# Patient Record
Sex: Female | Born: 1965 | Race: White | Hispanic: No | Marital: Married | Smoking: Never smoker
Health system: Southern US, Community
[De-identification: ages and names within clinical notes are randomized; demographics above are authoritative.]

## PROBLEM LIST (undated history)

## (undated) DIAGNOSIS — T8859XA Other complications of anesthesia, initial encounter: Secondary | ICD-10-CM

## (undated) DIAGNOSIS — R112 Nausea with vomiting, unspecified: Secondary | ICD-10-CM

## (undated) DIAGNOSIS — K219 Gastro-esophageal reflux disease without esophagitis: Secondary | ICD-10-CM

## (undated) DIAGNOSIS — J189 Pneumonia, unspecified organism: Secondary | ICD-10-CM

## (undated) DIAGNOSIS — M1710 Unilateral primary osteoarthritis, unspecified knee: Secondary | ICD-10-CM

## (undated) DIAGNOSIS — Z9889 Other specified postprocedural states: Secondary | ICD-10-CM

## (undated) DIAGNOSIS — M179 Osteoarthritis of knee, unspecified: Secondary | ICD-10-CM

## (undated) DIAGNOSIS — T4145XA Adverse effect of unspecified anesthetic, initial encounter: Secondary | ICD-10-CM

## (undated) HISTORY — PX: BREAST ENHANCEMENT SURGERY: SHX7

## (undated) HISTORY — PX: KNEE SURGERY: SHX244

## (undated) HISTORY — PX: COLONOSCOPY: SHX174

## (undated) HISTORY — PX: VENOUS ABLATION: SHX2656

## (undated) HISTORY — PX: TONSILLECTOMY: SUR1361

## (undated) HISTORY — PX: APPENDECTOMY: SHX54

---

## 1989-03-01 HISTORY — PX: ABDOMINAL HYSTERECTOMY: SHX81

## 2012-07-24 ENCOUNTER — Other Ambulatory Visit (HOSPITAL_BASED_OUTPATIENT_CLINIC_OR_DEPARTMENT_OTHER): Payer: Self-pay | Admitting: Family Medicine

## 2012-07-24 ENCOUNTER — Ambulatory Visit (HOSPITAL_BASED_OUTPATIENT_CLINIC_OR_DEPARTMENT_OTHER)
Admission: RE | Admit: 2012-07-24 | Discharge: 2012-07-24 | Disposition: A | Discharge status: Home / Self Care | Payer: BC Managed Care – PPO | Source: Ambulatory Visit | Attending: Family Medicine | Admitting: Family Medicine

## 2012-07-24 DIAGNOSIS — M25569 Pain in unspecified knee: Secondary | ICD-10-CM | POA: Insufficient documentation

## 2012-07-24 DIAGNOSIS — R52 Pain, unspecified: Secondary | ICD-10-CM

## 2012-07-24 DIAGNOSIS — X500XXA Overexertion from strenuous movement or load, initial encounter: Secondary | ICD-10-CM | POA: Insufficient documentation

## 2012-07-24 DIAGNOSIS — S99929A Unspecified injury of unspecified foot, initial encounter: Secondary | ICD-10-CM | POA: Insufficient documentation

## 2012-07-24 DIAGNOSIS — Y9301 Activity, walking, marching and hiking: Secondary | ICD-10-CM | POA: Insufficient documentation

## 2012-07-24 DIAGNOSIS — S8990XA Unspecified injury of unspecified lower leg, initial encounter: Secondary | ICD-10-CM | POA: Insufficient documentation

## 2014-04-02 ENCOUNTER — Other Ambulatory Visit: Payer: Self-pay | Admitting: Physician Assistant

## 2014-04-02 NOTE — H&P (Signed)
TOTAL KNEE ADMISSION H&P  Patient is being admitted for left total knee arthroplasty.  Subjective:  Chief Complaint:left knee pain.  HPI: [name], 49 y.o. female, has a history of pain and functional disability in the left knee due to arthritis and has failed non-surgical conservative treatments for greater than 12 weeks to includeNSAID's and/or analgesics, corticosteriod injections, viscosupplementation injections and activity modification.  Onset of symptoms was gradual, starting 2 years ago with rapidlly worsening course since that time. The patient noted prior procedures on the knee to include  arthroscopy and menisectomy on the left knee(s).  Patient currently rates pain in the left knee(s) at 8 out of 10 with activity. Patient has night pain, worsening of pain with activity and weight bearing, pain that interferes with activities of daily living, crepitus and joint swelling.  Patient has evidence of subchondral sclerosis and joint space narrowing by imaging studies. There is no active infection.  There are no active problems to display for this patient.  No past medical history on file.  No past surgical history on file.   (Not in a hospital admission) Allergies not on file  History  Substance Use Topics  . Smoking status: Not on file  . Smokeless tobacco: Not on file  . Alcohol Use: Not on file    No family history on file.   Review of Systems  Constitutional: Negative.   HENT: Negative.   Eyes: Negative.   Respiratory: Negative.   Cardiovascular: Negative.   Gastrointestinal: Negative.   Genitourinary: Negative.   Musculoskeletal: Positive for joint pain.  Skin: Negative.   Neurological: Negative.   Endo/Heme/Allergies: Negative.   Psychiatric/Behavioral: Negative.     Objective:  Physical Exam  Constitutional: She is oriented to person, place, and time. She appears well-developed and well-nourished.  HENT:  Head: Normocephalic and atraumatic.  Eyes: EOM  are normal. Pupils are equal, round, and reactive to light.  Neck: Normal range of motion. Neck supple.  Cardiovascular: Normal rate and regular rhythm.  Exam reveals no friction rub.   No murmur heard. Respiratory: Effort normal and breath sounds normal. No respiratory distress. She has no wheezes. She has no rales.  GI: Soft. Bowel sounds are normal. She exhibits no distension.  Musculoskeletal:  Examination of her left knee reveals a trace effusion.  Range of motion 0-90 degrees.  Medial joint line tenderness to palpation.  Moderate patellofemoral crepitus.  Negative straight leg raise.  Negative log roll.  She is stable to valgus and varus stress.  She is neurovascularly intact distally.  Neurological: She is alert and oriented to person, place, and time.  Skin: Skin is warm and dry.  Psychiatric: She has a normal mood and affect. Her behavior is normal. Judgment and thought content normal.    Vital signs in last 24 hours: @VSRANGES @  Labs:   There is no height or weight on file to calculate BMI.   Imaging Review Plain radiographs demonstrate severe degenerative joint disease of the left knee(s). The overall alignment ismild varus. The bone quality appears to be fair for age and reported activity level.  Assessment/Plan:  End stage arthritis, left knee   The patient history, physical examination, clinical judgment of the provider and imaging studies are consistent with end stage degenerative joint disease of the left knee(s) and total knee arthroplasty is deemed medically necessary. The treatment options including medical management, injection therapy arthroscopy and arthroplasty were discussed at length. The risks and benefits of total knee arthroplasty were presented  and reviewed. The risks due to aseptic loosening, infection, stiffness, patella tracking problems, thromboembolic complications and other imponderables were discussed. The patient acknowledged the explanation, agreed  to proceed with the plan and consent was signed. Patient is being admitted for inpatient treatment for surgery, pain control, PT, OT, prophylactic antibiotics, VTE prophylaxis, progressive ambulation and ADL's and discharge planning. The patient is planning to be discharged home with home health services

## 2014-04-04 ENCOUNTER — Encounter (HOSPITAL_COMMUNITY)
Admission: RE | Admit: 2014-04-04 | Discharge: 2014-04-04 | Disposition: A | Discharge status: Home / Self Care | Payer: BLUE CROSS/BLUE SHIELD | Source: Ambulatory Visit | Attending: Orthopedic Surgery | Admitting: Orthopedic Surgery

## 2014-04-04 ENCOUNTER — Encounter (HOSPITAL_COMMUNITY): Payer: Self-pay

## 2014-04-04 DIAGNOSIS — Z0183 Encounter for blood typing: Secondary | ICD-10-CM | POA: Diagnosis not present

## 2014-04-04 DIAGNOSIS — M179 Osteoarthritis of knee, unspecified: Secondary | ICD-10-CM | POA: Insufficient documentation

## 2014-04-04 DIAGNOSIS — Z01812 Encounter for preprocedural laboratory examination: Secondary | ICD-10-CM | POA: Diagnosis present

## 2014-04-04 DIAGNOSIS — R9431 Abnormal electrocardiogram [ECG] [EKG]: Secondary | ICD-10-CM | POA: Diagnosis not present

## 2014-04-04 HISTORY — DX: Other specified postprocedural states: Z98.890

## 2014-04-04 HISTORY — DX: Gastro-esophageal reflux disease without esophagitis: K21.9

## 2014-04-04 HISTORY — DX: Pneumonia, unspecified organism: J18.9

## 2014-04-04 HISTORY — DX: Other complications of anesthesia, initial encounter: T88.59XA

## 2014-04-04 HISTORY — DX: Adverse effect of unspecified anesthetic, initial encounter: T41.45XA

## 2014-04-04 HISTORY — DX: Nausea with vomiting, unspecified: R11.2

## 2014-04-04 LAB — PROTIME-INR
INR: 0.96 (ref 0.00–1.49)
PROTHROMBIN TIME: 12.9 s (ref 11.6–15.2)

## 2014-04-04 LAB — CBC WITH DIFFERENTIAL/PLATELET
Basophils Absolute: 0 10*3/uL (ref 0.0–0.1)
Basophils Relative: 0 % (ref 0–1)
EOS PCT: 2 % (ref 0–5)
Eosinophils Absolute: 0.1 10*3/uL (ref 0.0–0.7)
HCT: 40.3 % (ref 36.0–46.0)
Hemoglobin: 13.2 g/dL (ref 12.0–15.0)
LYMPHS ABS: 2.1 10*3/uL (ref 0.7–4.0)
Lymphocytes Relative: 36 % (ref 12–46)
MCH: 27.3 pg (ref 26.0–34.0)
MCHC: 32.8 g/dL (ref 30.0–36.0)
MCV: 83.4 fL (ref 78.0–100.0)
Monocytes Absolute: 0.5 10*3/uL (ref 0.1–1.0)
Monocytes Relative: 8 % (ref 3–12)
Neutro Abs: 3.2 10*3/uL (ref 1.7–7.7)
Neutrophils Relative %: 54 % (ref 43–77)
Platelets: 358 10*3/uL (ref 150–400)
RBC: 4.83 MIL/uL (ref 3.87–5.11)
RDW: 14.1 % (ref 11.5–15.5)
WBC: 6 10*3/uL (ref 4.0–10.5)

## 2014-04-04 LAB — COMPREHENSIVE METABOLIC PANEL
ALT: 15 U/L (ref 0–35)
AST: 19 U/L (ref 0–37)
Albumin: 4 g/dL (ref 3.5–5.2)
Alkaline Phosphatase: 84 U/L (ref 39–117)
Anion gap: 6 (ref 5–15)
BILIRUBIN TOTAL: 0.5 mg/dL (ref 0.3–1.2)
BUN: 10 mg/dL (ref 6–23)
CALCIUM: 9.3 mg/dL (ref 8.4–10.5)
CO2: 30 mmol/L (ref 19–32)
Chloride: 102 mmol/L (ref 96–112)
Creatinine, Ser: 0.68 mg/dL (ref 0.50–1.10)
GFR calc Af Amer: >90 mL/min (ref >90)
GFR calc non Af Amer: >90 mL/min (ref >90)
Glucose, Bld: 94 mg/dL (ref 70–99)
Potassium: 3.6 mmol/L (ref 3.5–5.1)
SODIUM: 138 mmol/L (ref 135–145)
Total Protein: 7 g/dL (ref 6.0–8.3)

## 2014-04-04 LAB — URINALYSIS, ROUTINE W REFLEX MICROSCOPIC
BILIRUBIN URINE: NEGATIVE
Glucose, UA: NEGATIVE mg/dL
HGB URINE DIPSTICK: NEGATIVE
Ketones, ur: NEGATIVE mg/dL
LEUKOCYTES UA: NEGATIVE
Nitrite: NEGATIVE
Protein, ur: NEGATIVE mg/dL
Specific Gravity, Urine: 1.01 (ref 1.005–1.030)
Urobilinogen, UA: 0.2 mg/dL (ref 0.0–1.0)
pH: 7 (ref 5.0–8.0)

## 2014-04-04 LAB — SURGICAL PCR SCREEN
MRSA, PCR: NEGATIVE
Staphylococcus aureus: NEGATIVE

## 2014-04-04 LAB — TYPE AND SCREEN
ABO/RH(D): O POS
Antibody Screen: NEGATIVE

## 2014-04-04 LAB — ABO/RH: ABO/RH(D): O POS

## 2014-04-04 LAB — APTT: APTT: 32 s (ref 24–37)

## 2014-04-04 NOTE — Pre-Procedure Instructions (Signed)
[name]  04/04/2014   Your procedure is scheduled on:  Wednesday April 17, 2014 at 9:55 AM.  [facility] at 7:50 AM.  Call this number if you have problems the morning of surgery: [phone]  For any other questions Monday-Friday from 8am-4pm call: [phone]    Remember:   Do not eat food or drink liquids after midnight.   Take these medicines the morning of surgery with A SIP OF WATER: Acetaminophen (Tylenol) if needed, Rabeprazole (Aciphex)   Please stop taking any Ibuprofen, Advil, Motrin, Alleve, etc on Wednesday February 10th   Do not wear jewelry, make-up or nail polish.  Do not wear lotions, powders, or perfumes.   Do not shave 48 hours prior to surgery.   Do not bring valuables to the hospital.  [facility] is not responsible for any belongings or valuables.               Contacts, dentures or bridgework may not be worn into surgery.  Leave suitcase in the car. After surgery it may be brought to your room.  For patients admitted to the hospital, discharge time is determined by your treatment team.               Patients discharged the day of surgery will not be allowed to drive home.  Name and phone number of your driver:  Special Instructions: Shower using CHG soap the night before and the morning of your surgery   Please read over the following fact sheets that you were given: Pain Booklet, Coughing and Deep Breathing, Blood Transfusion Information, Total Joint Packet, MRSA Information and Surgical Site Infection Prevention

## 2014-04-04 NOTE — Progress Notes (Signed)
Patient was scheduled for a 1100 PAT appointment, but stated Dr. Greig RightMurphy's office told her to arrive here today at 0830. Husband at chair side during PAT appointment. Nurse explained to patient and her husband that PAT does not schedule half hour appointments, but it was OK and she could be fitted into schedule. Patient became tearful during PAT and appeared to be anxious about upcoming surgery. Nurse explained to patient that it was OK and not to worry about the 1100 appointment.  Facial tissues offered. Tears creased.  Patient denied having any cardiac or pulmonary issues. PCP is [facility].   Patient asked Nurse if Vivelle-Dot (Estrogen patch) needed to be stopped before surgery. Patient stated she asked Lillia AbedLindsay, [place] at Dr. Greig RightMurphy's office yesterday and she stated she thought it was ok for her to continue taking it, but she needed to ask during PAT appointment. Patient also stated that a friend of hers recently had knee surgery and she was told to stop using her estrogen patch a few weeks before surgery. Patients husband stated "oh you don't want her off that patch....not even the devil would want to deal with her!" Nurse called Dr. Katrinka BlazingSmith (Anesthesia) and informed him of this, and he stated that he did not want the patient to lose her "femininity" and that it was OK for her to continue use of estrogen patch. Patient informed of this and seemed to be very happy about continuing estrogen patch.

## 2014-04-05 LAB — URINE CULTURE
COLONY COUNT: NO GROWTH
Culture: NO GROWTH

## 2014-04-16 MED ORDER — LACTATED RINGERS IV SOLN
INTRAVENOUS | Status: DC
  Administered 2014-04-17: 08:00:00 via INTRAVENOUS

## 2014-04-16 MED ORDER — CEFAZOLIN SODIUM-DEXTROSE 2-3 GM-% IV SOLR
2.0000 g | INTRAVENOUS | Status: AC
  Administered 2014-04-17: 2 g via INTRAVENOUS

## 2014-04-17 ENCOUNTER — Inpatient Hospital Stay (HOSPITAL_COMMUNITY): Payer: BLUE CROSS/BLUE SHIELD | Admitting: Anesthesiology

## 2014-04-17 ENCOUNTER — Encounter (HOSPITAL_COMMUNITY)
Admission: RE | Disposition: A | Discharge status: Home Health | Payer: Self-pay | Source: Ambulatory Visit | Attending: Orthopedic Surgery

## 2014-04-17 ENCOUNTER — Inpatient Hospital Stay (HOSPITAL_COMMUNITY)
Admission: RE | Admit: 2014-04-17 | Discharge: 2014-04-19 | DRG: 470 | Disposition: A | Discharge status: Home Health | Payer: BLUE CROSS/BLUE SHIELD | Source: Ambulatory Visit | Attending: Orthopedic Surgery | Admitting: Orthopedic Surgery

## 2014-04-17 ENCOUNTER — Encounter (HOSPITAL_COMMUNITY): Payer: Self-pay | Admitting: *Deleted

## 2014-04-17 ENCOUNTER — Inpatient Hospital Stay (HOSPITAL_COMMUNITY): Payer: BLUE CROSS/BLUE SHIELD

## 2014-04-17 DIAGNOSIS — M179 Osteoarthritis of knee, unspecified: Secondary | ICD-10-CM | POA: Diagnosis present

## 2014-04-17 DIAGNOSIS — Z7901 Long term (current) use of anticoagulants: Secondary | ICD-10-CM | POA: Diagnosis not present

## 2014-04-17 DIAGNOSIS — D62 Acute posthemorrhagic anemia: Secondary | ICD-10-CM | POA: Diagnosis not present

## 2014-04-17 DIAGNOSIS — M1712 Unilateral primary osteoarthritis, left knee: Secondary | ICD-10-CM | POA: Diagnosis present

## 2014-04-17 DIAGNOSIS — Z79899 Other long term (current) drug therapy: Secondary | ICD-10-CM

## 2014-04-17 DIAGNOSIS — M25562 Pain in left knee: Secondary | ICD-10-CM | POA: Diagnosis present

## 2014-04-17 DIAGNOSIS — Z6834 Body mass index (BMI) 34.0-34.9, adult: Secondary | ICD-10-CM

## 2014-04-17 DIAGNOSIS — M1710 Unilateral primary osteoarthritis, unspecified knee: Secondary | ICD-10-CM | POA: Diagnosis present

## 2014-04-17 DIAGNOSIS — K219 Gastro-esophageal reflux disease without esophagitis: Secondary | ICD-10-CM | POA: Diagnosis present

## 2014-04-17 DIAGNOSIS — R338 Other retention of urine: Secondary | ICD-10-CM | POA: Diagnosis not present

## 2014-04-17 DIAGNOSIS — Z96652 Presence of left artificial knee joint: Secondary | ICD-10-CM

## 2014-04-17 DIAGNOSIS — Z96659 Presence of unspecified artificial knee joint: Secondary | ICD-10-CM

## 2014-04-17 HISTORY — DX: Osteoarthritis of knee, unspecified: M17.9

## 2014-04-17 HISTORY — DX: Unilateral primary osteoarthritis, unspecified knee: M17.10

## 2014-04-17 HISTORY — PX: TOTAL KNEE ARTHROPLASTY: SHX125

## 2014-04-17 SURGERY — ARTHROPLASTY, KNEE, TOTAL
Anesthesia: General | Site: Knee | Laterality: Left

## 2014-04-17 MED ORDER — METHOCARBAMOL 500 MG PO TABS: 500.0000 mg | ORAL_TABLET | Freq: Four times a day (QID) | ORAL | Status: DC

## 2014-04-17 MED ORDER — PROPOFOL 10 MG/ML IV BOLUS
INTRAVENOUS | Status: DC | PRN
  Administered 2014-04-17: 150 mg via INTRAVENOUS

## 2014-04-17 MED ORDER — APIXABAN 2.5 MG PO TABS
2.5000 mg | ORAL_TABLET | Freq: Two times a day (BID) | ORAL | Status: DC
  Administered 2014-04-18 – 2014-04-19 (×3): 2.5 mg via ORAL
  Filled 2014-04-17 (×4): qty 1

## 2014-04-17 MED ORDER — ROCURONIUM BROMIDE 100 MG/10ML IV SOLN
INTRAVENOUS | Status: DC | PRN
  Administered 2014-04-17: 40 mg via INTRAVENOUS

## 2014-04-17 MED ORDER — ONDANSETRON HCL 4 MG PO TABS: 4.0000 mg | ORAL_TABLET | Freq: Four times a day (QID) | ORAL | Status: DC | PRN

## 2014-04-17 MED ORDER — ADULT MULTIVITAMIN W/MINERALS CH
1.0000 | ORAL_TABLET | Freq: Every day | ORAL | Status: DC
  Administered 2014-04-18 – 2014-04-19 (×2): 1 via ORAL
  Filled 2014-04-17 (×4): qty 1

## 2014-04-17 MED ORDER — HYDROMORPHONE HCL 1 MG/ML IJ SOLN
0.2500 mg | INTRAMUSCULAR | Status: DC | PRN
  Administered 2014-04-17 (×3): 0.5 mg via INTRAVENOUS

## 2014-04-17 MED ORDER — DEXAMETHASONE SODIUM PHOSPHATE 4 MG/ML IJ SOLN
INTRAMUSCULAR | Status: DC | PRN
  Administered 2014-04-17: 8 mg via INTRAVENOUS

## 2014-04-17 MED ORDER — ONDANSETRON HCL 4 MG/2ML IJ SOLN
INTRAMUSCULAR | Status: DC | PRN
  Administered 2014-04-17: 4 mg via INTRAVENOUS

## 2014-04-17 MED ORDER — BUPIVACAINE LIPOSOME 1.3 % IJ SUSP
INTRAMUSCULAR | Status: DC | PRN
  Administered 2014-04-17: 20 mL

## 2014-04-17 MED ORDER — KETOROLAC TROMETHAMINE 30 MG/ML IJ SOLN
30.0000 mg | Freq: Once | INTRAMUSCULAR | Status: AC
  Administered 2014-04-17: 30 mg via INTRAVENOUS

## 2014-04-17 MED ORDER — KETOROLAC TROMETHAMINE 30 MG/ML IJ SOLN
INTRAMUSCULAR | Status: AC
  Filled 2014-04-17: qty 1

## 2014-04-17 MED ORDER — ACETAMINOPHEN 325 MG PO TABS: 650.0000 mg | ORAL_TABLET | Freq: Four times a day (QID) | ORAL | Status: DC | PRN

## 2014-04-17 MED ORDER — OXYCODONE HCL 5 MG PO TABS
5.0000 mg | ORAL_TABLET | Freq: Once | ORAL | Status: AC | PRN
  Administered 2014-04-17: 5 mg via ORAL

## 2014-04-17 MED ORDER — CEFAZOLIN SODIUM-DEXTROSE 2-3 GM-% IV SOLR
2.0000 g | Freq: Four times a day (QID) | INTRAVENOUS | Status: AC
  Administered 2014-04-17 (×2): 2 g via INTRAVENOUS
  Filled 2014-04-17 (×2): qty 50

## 2014-04-17 MED ORDER — FENTANYL CITRATE 0.05 MG/ML IJ SOLN
INTRAMUSCULAR | Status: AC
  Filled 2014-04-17: qty 5

## 2014-04-17 MED ORDER — OXYCODONE HCL 5 MG PO TABS
ORAL_TABLET | ORAL | Status: AC
  Filled 2014-04-17: qty 1

## 2014-04-17 MED ORDER — METOCLOPRAMIDE HCL 10 MG PO TABS: 5.0000 mg | ORAL_TABLET | Freq: Three times a day (TID) | ORAL | Status: DC | PRN

## 2014-04-17 MED ORDER — ACETAMINOPHEN 650 MG RE SUPP: 650.0000 mg | Freq: Four times a day (QID) | RECTAL | Status: DC | PRN

## 2014-04-17 MED ORDER — PROPOFOL 10 MG/ML IV BOLUS
INTRAVENOUS | Status: AC
  Filled 2014-04-17: qty 20

## 2014-04-17 MED ORDER — SODIUM CHLORIDE 0.9 % IR SOLN
Status: DC | PRN
  Administered 2014-04-17 (×2): 1000 mL

## 2014-04-17 MED ORDER — GLYCOPYRROLATE 0.2 MG/ML IJ SOLN
INTRAMUSCULAR | Status: DC | PRN
  Administered 2014-04-17: .7 mg via INTRAVENOUS

## 2014-04-17 MED ORDER — OXYCODONE HCL 5 MG/5ML PO SOLN: 5.0000 mg | Freq: Once | ORAL | Status: AC | PRN

## 2014-04-17 MED ORDER — PANTOPRAZOLE SODIUM 40 MG PO TBEC
40.0000 mg | DELAYED_RELEASE_TABLET | Freq: Every day | ORAL | Status: DC
  Administered 2014-04-18 – 2014-04-19 (×2): 40 mg via ORAL
  Filled 2014-04-17 (×2): qty 1

## 2014-04-17 MED ORDER — POTASSIUM CHLORIDE IN NACL 20-0.9 MEQ/L-% IV SOLN
INTRAVENOUS | Status: DC
  Administered 2014-04-18: 04:00:00 via INTRAVENOUS
  Filled 2014-04-17 (×3): qty 1000

## 2014-04-17 MED ORDER — DIPHENHYDRAMINE HCL 12.5 MG/5ML PO ELIX: 12.5000 mg | ORAL_SOLUTION | ORAL | Status: DC | PRN

## 2014-04-17 MED ORDER — FENTANYL CITRATE 0.05 MG/ML IJ SOLN
INTRAMUSCULAR | Status: DC | PRN
  Administered 2014-04-17: 150 ug via INTRAVENOUS
  Administered 2014-04-17: 100 ug via INTRAVENOUS

## 2014-04-17 MED ORDER — BUPIVACAINE HCL 0.5 % IJ SOLN
INTRAMUSCULAR | Status: DC | PRN
  Administered 2014-04-17: 10 mL

## 2014-04-17 MED ORDER — DOCUSATE SODIUM 100 MG PO CAPS
100.0000 mg | ORAL_CAPSULE | Freq: Two times a day (BID) | ORAL | Status: DC
  Administered 2014-04-17 – 2014-04-18 (×3): 100 mg via ORAL
  Filled 2014-04-17 (×3): qty 1

## 2014-04-17 MED ORDER — BISACODYL 5 MG PO TBEC: 5.0000 mg | DELAYED_RELEASE_TABLET | Freq: Every day | ORAL | Status: DC | PRN

## 2014-04-17 MED ORDER — HYDROMORPHONE HCL 1 MG/ML IJ SOLN
INTRAMUSCULAR | Status: AC
  Filled 2014-04-17: qty 2

## 2014-04-17 MED ORDER — 0.9 % SODIUM CHLORIDE (POUR BTL) OPTIME
TOPICAL | Status: DC | PRN
  Administered 2014-04-17: 1000 mL

## 2014-04-17 MED ORDER — LACTATED RINGERS IV SOLN
INTRAVENOUS | Status: DC | PRN
  Administered 2014-04-17 (×2): via INTRAVENOUS

## 2014-04-17 MED ORDER — PROMETHAZINE HCL 25 MG/ML IJ SOLN: 6.2500 mg | INTRAMUSCULAR | Status: DC | PRN

## 2014-04-17 MED ORDER — NEOSTIGMINE METHYLSULFATE 10 MG/10ML IV SOLN
INTRAVENOUS | Status: DC | PRN
  Administered 2014-04-17: 4 mg via INTRAVENOUS

## 2014-04-17 MED ORDER — MENTHOL 3 MG MT LOZG: 1.0000 | LOZENGE | OROMUCOSAL | Status: DC | PRN

## 2014-04-17 MED ORDER — METOCLOPRAMIDE HCL 5 MG/ML IJ SOLN: 5.0000 mg | Freq: Three times a day (TID) | INTRAMUSCULAR | Status: DC | PRN

## 2014-04-17 MED ORDER — MIDAZOLAM HCL 5 MG/5ML IJ SOLN
INTRAMUSCULAR | Status: DC | PRN
  Administered 2014-04-17: 2 mg via INTRAVENOUS

## 2014-04-17 MED ORDER — LIDOCAINE HCL (CARDIAC) 20 MG/ML IV SOLN
INTRAVENOUS | Status: DC | PRN
  Administered 2014-04-17: 70 mg via INTRAVENOUS

## 2014-04-17 MED ORDER — DEXAMETHASONE SODIUM PHOSPHATE 10 MG/ML IJ SOLN
10.0000 mg | Freq: Once | INTRAMUSCULAR | Status: AC
  Administered 2014-04-18: 10 mg via INTRAVENOUS
  Filled 2014-04-17: qty 1

## 2014-04-17 MED ORDER — BUPIVACAINE LIPOSOME 1.3 % IJ SUSP
20.0000 mL | INTRAMUSCULAR | Status: DC
  Filled 2014-04-17: qty 20

## 2014-04-17 MED ORDER — OXYCODONE HCL 5 MG PO TABS
5.0000 mg | ORAL_TABLET | ORAL | Status: DC | PRN
  Administered 2014-04-17 – 2014-04-19 (×13): 10 mg via ORAL
  Filled 2014-04-17 (×13): qty 2

## 2014-04-17 MED ORDER — METHOCARBAMOL 500 MG PO TABS
500.0000 mg | ORAL_TABLET | Freq: Four times a day (QID) | ORAL | Status: DC | PRN
Start: 2014-04-17 — End: 2014-04-19
  Administered 2014-04-17 – 2014-04-19 (×7): 500 mg via ORAL
  Filled 2014-04-17 (×7): qty 1

## 2014-04-17 MED ORDER — MIDAZOLAM HCL 2 MG/2ML IJ SOLN
INTRAMUSCULAR | Status: AC
  Filled 2014-04-17: qty 2

## 2014-04-17 MED ORDER — HYDROMORPHONE HCL 1 MG/ML IJ SOLN
0.5000 mg | INTRAMUSCULAR | Status: DC | PRN
  Filled 2014-04-17: qty 1

## 2014-04-17 MED ORDER — CELECOXIB 200 MG PO CAPS
200.0000 mg | ORAL_CAPSULE | Freq: Two times a day (BID) | ORAL | Status: DC
  Administered 2014-04-17 – 2014-04-19 (×4): 200 mg via ORAL
  Filled 2014-04-17 (×4): qty 1

## 2014-04-17 MED ORDER — HYDROMORPHONE HCL 1 MG/ML IJ SOLN
0.2500 mg | INTRAMUSCULAR | Status: DC | PRN
  Administered 2014-04-17 (×4): 0.5 mg via INTRAVENOUS

## 2014-04-17 MED ORDER — ONDANSETRON HCL 4 MG PO TABS: 4.0000 mg | ORAL_TABLET | Freq: Three times a day (TID) | ORAL | Status: DC | PRN

## 2014-04-17 MED ORDER — METHOCARBAMOL 1000 MG/10ML IJ SOLN
500.0000 mg | Freq: Four times a day (QID) | INTRAVENOUS | Status: DC | PRN
  Administered 2014-04-17: 500 mg via INTRAVENOUS
  Filled 2014-04-17 (×2): qty 5

## 2014-04-17 MED ORDER — ONDANSETRON HCL 4 MG/2ML IJ SOLN
4.0000 mg | Freq: Four times a day (QID) | INTRAMUSCULAR | Status: DC | PRN
  Administered 2014-04-17: 4 mg via INTRAVENOUS
  Filled 2014-04-17: qty 2

## 2014-04-17 MED ORDER — OXYCODONE-ACETAMINOPHEN 5-325 MG PO TABS: 1.0000 | ORAL_TABLET | ORAL | Status: DC | PRN

## 2014-04-17 MED ORDER — PHENOL 1.4 % MT LIQD: 1.0000 | OROMUCOSAL | Status: DC | PRN

## 2014-04-17 SURGICAL SUPPLY — 67 items
BANDAGE ELASTIC 4 VELCRO ST LF (GAUZE/BANDAGES/DRESSINGS) ×3 IMPLANT
BANDAGE ELASTIC 6 VELCRO ST LF (GAUZE/BANDAGES/DRESSINGS) ×3 IMPLANT
BANDAGE ESMARK 6X9 LF (GAUZE/BANDAGES/DRESSINGS) ×1 IMPLANT
BENZOIN TINCTURE PRP APPL 2/3 (GAUZE/BANDAGES/DRESSINGS) ×3 IMPLANT
BLADE SAG 18X100X1.27 (BLADE) ×6 IMPLANT
BLADE SAW SGTL 83.5X18.5 (BLADE) IMPLANT
BNDG ESMARK 6X9 LF (GAUZE/BANDAGES/DRESSINGS) ×3
BOWL SMART MIX CTS (DISPOSABLE) ×3 IMPLANT
CAPT KNEE TOTAL 3 ×3 IMPLANT
CEMENT BONE SIMPLEX SPEEDSET (Cement) ×6 IMPLANT
CLOSURE WOUND 1/2 X4 (GAUZE/BANDAGES/DRESSINGS) ×1
COVER SURGICAL LIGHT HANDLE (MISCELLANEOUS) ×3 IMPLANT
CUFF TOURNIQUET SINGLE 34IN LL (TOURNIQUET CUFF) ×3 IMPLANT
DRAPE EXTREMITY T 121X128X90 (DRAPE) ×3 IMPLANT
DRAPE IMP U-DRAPE 54X76 (DRAPES) ×3 IMPLANT
DRAPE PROXIMA HALF (DRAPES) ×3 IMPLANT
DRAPE U-SHAPE 47X51 STRL (DRAPES) ×3 IMPLANT
DRSG PAD ABDOMINAL 8X10 ST (GAUZE/BANDAGES/DRESSINGS) ×3 IMPLANT
DURAPREP 26ML APPLICATOR (WOUND CARE) ×6 IMPLANT
ELECT CAUTERY BLADE 6.4 (BLADE) ×3 IMPLANT
ELECT REM PT RETURN 9FT ADLT (ELECTROSURGICAL) ×3
ELECTRODE REM PT RTRN 9FT ADLT (ELECTROSURGICAL) ×1 IMPLANT
EVACUATOR 1/8 PVC DRAIN (DRAIN) ×3 IMPLANT
FACESHIELD WRAPAROUND (MASK) ×6 IMPLANT
GAUZE SPONGE 4X4 12PLY STRL (GAUZE/BANDAGES/DRESSINGS) ×3 IMPLANT
GLOVE BIOGEL PI IND STRL 7.0 (GLOVE) ×3 IMPLANT
GLOVE BIOGEL PI INDICATOR 7.0 (GLOVE) ×6
GLOVE ECLIPSE 6.5 STRL STRAW (GLOVE) ×12 IMPLANT
GLOVE ORTHO TXT STRL SZ7.5 (GLOVE) ×3 IMPLANT
GLOVE SURG SS PI 8.0 STRL IVOR (GLOVE) ×6 IMPLANT
GOWN STRL REUS W/ TWL LRG LVL3 (GOWN DISPOSABLE) ×2 IMPLANT
GOWN STRL REUS W/ TWL XL LVL3 (GOWN DISPOSABLE) ×1 IMPLANT
GOWN STRL REUS W/TWL 2XL LVL3 (GOWN DISPOSABLE) ×3 IMPLANT
GOWN STRL REUS W/TWL LRG LVL3 (GOWN DISPOSABLE) ×4
GOWN STRL REUS W/TWL XL LVL3 (GOWN DISPOSABLE) ×2
HANDPIECE INTERPULSE COAX TIP (DISPOSABLE) ×2
IMMOBILIZER KNEE 22 UNIV (SOFTGOODS) ×3 IMPLANT
IMMOBILIZER KNEE 24 THIGH 36 (MISCELLANEOUS) IMPLANT
IMMOBILIZER KNEE 24 UNIV (MISCELLANEOUS)
KIT BASIN OR (CUSTOM PROCEDURE TRAY) ×3 IMPLANT
KIT ROOM TURNOVER OR (KITS) ×3 IMPLANT
MANIFOLD NEPTUNE II (INSTRUMENTS) ×3 IMPLANT
NEEDLE 18GX1X1/2 (RX/OR ONLY) (NEEDLE) ×3 IMPLANT
NEEDLE HYPO 25GX1X1/2 BEV (NEEDLE) ×3 IMPLANT
NS IRRIG 1000ML POUR BTL (IV SOLUTION) ×3 IMPLANT
PACK TOTAL JOINT (CUSTOM PROCEDURE TRAY) ×3 IMPLANT
PACK UNIVERSAL I (CUSTOM PROCEDURE TRAY) ×3 IMPLANT
PAD ARMBOARD 7.5X6 YLW CONV (MISCELLANEOUS) ×6 IMPLANT
PAD CAST 4YDX4 CTTN HI CHSV (CAST SUPPLIES) IMPLANT
PADDING CAST COTTON 4X4 STRL (CAST SUPPLIES)
PADDING CAST COTTON 6X4 STRL (CAST SUPPLIES) ×3 IMPLANT
SET HNDPC FAN SPRY TIP SCT (DISPOSABLE) ×1 IMPLANT
STRIP CLOSURE SKIN 1/2X4 (GAUZE/BANDAGES/DRESSINGS) ×2 IMPLANT
SUCTION FRAZIER TIP 10 FR DISP (SUCTIONS) ×3 IMPLANT
SUT MNCRL AB 4-0 PS2 18 (SUTURE) ×3 IMPLANT
SUT VIC AB 0 CT1 27 (SUTURE) ×4
SUT VIC AB 0 CT1 27XBRD ANBCTR (SUTURE) ×2 IMPLANT
SUT VIC AB 1 CT1 27 (SUTURE) ×4
SUT VIC AB 1 CT1 27XBRD ANBCTR (SUTURE) ×2 IMPLANT
SUT VIC AB 2-0 CT1 27 (SUTURE) ×6
SUT VIC AB 2-0 CT1 TAPERPNT 27 (SUTURE) ×3 IMPLANT
SYR 50ML LL SCALE MARK (SYRINGE) ×3 IMPLANT
SYR CONTROL 10ML LL (SYRINGE) ×3 IMPLANT
TOWEL OR 17X24 6PK STRL BLUE (TOWEL DISPOSABLE) ×3 IMPLANT
TOWEL OR 17X26 10 PK STRL BLUE (TOWEL DISPOSABLE) ×3 IMPLANT
TRAY CATH 16FR W/PLASTIC CATH (SET/KITS/TRAYS/PACK) ×3 IMPLANT
WATER STERILE IRR 1000ML POUR (IV SOLUTION) IMPLANT

## 2014-04-17 NOTE — Transfer of Care (Signed)
Immediate Anesthesia Transfer of Care Note  Patient: [name]  Procedure(s) Performed: Procedure(s): TOTAL KNEE ARTHROPLASTY (Left)  Patient Location: PACU  Anesthesia Type:General  Level of Consciousness: awake, alert  and oriented  Airway & Oxygen Therapy: Patient Spontanous Breathing and Patient connected to nasal cannula oxygen  Post-op Assessment: Report given to RN and Post -op Vital signs reviewed and stable  Post vital signs: Reviewed and stable  Last Vitals:  Filed Vitals:   04/17/14 0817  BP: 141/61  Pulse: 83  Temp: 36.4 C  Resp: 18    Complications: No apparent anesthesia complications

## 2014-04-17 NOTE — Progress Notes (Signed)
Husband placed pt hormone patch on R buttock for pt. Per husband they were told to replace it as soon as she comes out of surgery.

## 2014-04-17 NOTE — Progress Notes (Signed)
Utilization review completed.  

## 2014-04-17 NOTE — Progress Notes (Signed)
Orthopedic Tech Progress Note Patient Details:  [name] 08/26/1965 [id number]  CPM Left Knee CPM Left Knee: On Left Knee Flexion (Degrees): 90 Left Knee Extension (Degrees): 0 Additional Comments: trapeze bar patient helper Viewed order from doctor's order list  [name], Vincy Feliz 04/17/2014, 12:08 PM

## 2014-04-17 NOTE — Progress Notes (Signed)
PT Cancellation Note  Patient Details Name: [name] MRN: [id number] DOB: 11/22/1965   Cancelled Treatment:    Reason Eval/Treat Not Completed: Fatigue/lethargy limiting ability to participate.  Pt lethargic and nauseated.  PT will attempt to check back a little later this PM to see if she feels up to EOB or OOB to chair.  If not, will re-attempt tomorrow.  Thanks,    Rollene Rotundaebecca B. Lizanne Erker, PT, DPT [phone]   04/17/2014, 4:08 PM

## 2014-04-17 NOTE — H&P (View-Only) (Signed)
TOTAL KNEE ADMISSION H&P  Patient is being admitted for left total knee arthroplasty.  Subjective:  Chief Complaint:left knee pain.  HPI: [name], 49 y.o. female, has a history of pain and functional disability in the left knee due to arthritis and has failed non-surgical conservative treatments for greater than 12 weeks to includeNSAID's and/or analgesics, corticosteriod injections, viscosupplementation injections and activity modification.  Onset of symptoms was gradual, starting 2 years ago with rapidlly worsening course since that time. The patient noted prior procedures on the knee to include  arthroscopy and menisectomy on the left knee(s).  Patient currently rates pain in the left knee(s) at 8 out of 10 with activity. Patient has night pain, worsening of pain with activity and weight bearing, pain that interferes with activities of daily living, crepitus and joint swelling.  Patient has evidence of subchondral sclerosis and joint space narrowing by imaging studies. There is no active infection.  There are no active problems to display for this patient.  No past medical history on file.  No past surgical history on file.   (Not in a hospital admission) Allergies not on file  History  Substance Use Topics  . Smoking status: Not on file  . Smokeless tobacco: Not on file  . Alcohol Use: Not on file    No family history on file.   Review of Systems  Constitutional: Negative.   HENT: Negative.   Eyes: Negative.   Respiratory: Negative.   Cardiovascular: Negative.   Gastrointestinal: Negative.   Genitourinary: Negative.   Musculoskeletal: Positive for joint pain.  Skin: Negative.   Neurological: Negative.   Endo/Heme/Allergies: Negative.   Psychiatric/Behavioral: Negative.     Objective:  Physical Exam  Constitutional: She is oriented to person, place, and time. She appears well-developed and well-nourished.  HENT:  Head: Normocephalic and atraumatic.  Eyes: EOM  are normal. Pupils are equal, round, and reactive to light.  Neck: Normal range of motion. Neck supple.  Cardiovascular: Normal rate and regular rhythm.  Exam reveals no friction rub.   No murmur heard. Respiratory: Effort normal and breath sounds normal. No respiratory distress. She has no wheezes. She has no rales.  GI: Soft. Bowel sounds are normal. She exhibits no distension.  Musculoskeletal:  Examination of her left knee reveals a trace effusion.  Range of motion 0-90 degrees.  Medial joint line tenderness to palpation.  Moderate patellofemoral crepitus.  Negative straight leg raise.  Negative log roll.  She is stable to valgus and varus stress.  She is neurovascularly intact distally.  Neurological: She is alert and oriented to person, place, and time.  Skin: Skin is warm and dry.  Psychiatric: She has a normal mood and affect. Her behavior is normal. Judgment and thought content normal.    Vital signs in last 24 hours: @VSRANGES @  Labs:   There is no height or weight on file to calculate BMI.   Imaging Review Plain radiographs demonstrate severe degenerative joint disease of the left knee(s). The overall alignment ismild varus. The bone quality appears to be fair for age and reported activity level.  Assessment/Plan:  End stage arthritis, left knee   The patient history, physical examination, clinical judgment of the provider and imaging studies are consistent with end stage degenerative joint disease of the left knee(s) and total knee arthroplasty is deemed medically necessary. The treatment options including medical management, injection therapy arthroscopy and arthroplasty were discussed at length. The risks and benefits of total knee arthroplasty were presented  and reviewed. The risks due to aseptic loosening, infection, stiffness, patella tracking problems, thromboembolic complications and other imponderables were discussed. The patient acknowledged the explanation, agreed  to proceed with the plan and consent was signed. Patient is being admitted for inpatient treatment for surgery, pain control, PT, OT, prophylactic antibiotics, VTE prophylaxis, progressive ambulation and ADL's and discharge planning. The patient is planning to be discharged home with home health services
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
## 2014-04-17 NOTE — Anesthesia Preprocedure Evaluation (Addendum)
Anesthesia Evaluation  Patient identified by MRN, date of birth, ID band Patient awake    Reviewed: Allergy & Precautions, NPO status , Patient's Chart, lab work & pertinent test results  Airway Mallampati: II  TM Distance: >3 FB Neck ROM: Full    Dental  (+) Teeth Intact, Dental Advisory Given   Pulmonary neg pulmonary ROS,  breath sounds clear to auscultation        Cardiovascular negative cardio ROS  Rhythm:Regular Rate:Normal     Neuro/Psych negative neurological ROS     GI/Hepatic Neg liver ROS, GERD-  ,  Endo/Other  Morbid obesity  Renal/GU negative Renal ROS     Musculoskeletal   Abdominal   Peds  Hematology   Anesthesia Other Findings   Reproductive/Obstetrics                            Anesthesia Physical Anesthesia Plan  ASA: II  Anesthesia Plan: General   Post-op Pain Management:    Induction: Intravenous  Airway Management Planned: LMA and Oral ETT  Additional Equipment:   Intra-op Plan:   Post-operative Plan: Extubation in OR  Informed Consent: I have reviewed the patients History and Physical, chart, labs and discussed the procedure including the risks, benefits and alternatives for the proposed anesthesia with the patient or authorized representative who has indicated his/her understanding and acceptance.   Dental advisory given  Plan Discussed with: CRNA  Anesthesia Plan Comments:        Anesthesia Quick Evaluation

## 2014-04-17 NOTE — Progress Notes (Signed)
Orthopedic Tech Progress Note Patient Details:  [name] 01/23/1966 [id number]  Patient ID: Ariel LowerKathy L Howe, female   DOB: 06/15/1965, 49 y.o.   MRN: [id number] Trapeze bar patient helper deleted; will be provided when one becomes available  [name], Cande Mastropietro 04/17/2014, 12:14 PM

## 2014-04-17 NOTE — Discharge Summary (Addendum)
Patient ID: [name] MRN: [id number] DOB/AGE: 1965-08-01 49 y.o.  Admit date: 04/17/2014 Discharge date: 04/19/2014  Admission Diagnoses:  Active Problems:   DJD (degenerative joint disease) of knee   Discharge Diagnoses:  Same  Past Medical History  Diagnosis Date  . Complication of anesthesia     Pt hands and feet itch after having anesthesia from too much fluid  . PONV (postoperative nausea and vomiting)   . Pneumonia     hx of  . GERD (gastroesophageal reflux disease)   . DJD (degenerative joint disease) of knee     Surgeries: Procedure(s): TOTAL KNEE ARTHROPLASTY on 04/17/2014   Consultants:    Discharged Condition: Improved  Hospital Course: [name] is an 48 y.o. female who was admitted 04/17/2014 for operative treatment of primary osteoarthritis left knee. Patient has severe unremitting pain that affects sleep, daily activities, and work/hobbies. After pre-op clearance the patient was taken to the operating room on 04/17/2014 and underwent  Procedure(s): TOTAL KNEE ARTHROPLASTY.  Patient with a pre-op Hb of 13.2 developed ABLA on pod #1 with a  Hb of 10.0.  Patient has been feeling very lightheaded and dizzy.  We will continue to follow her.  Patient was given perioperative antibiotics:      Anti-infectives    Start     Dose/Rate Route Frequency Ordered Stop   04/17/14 1600  ceFAZolin (ANCEF) IVPB 2 g/50 mL premix     2 g 100 mL/hr over 30 Minutes Intravenous Every 6 hours 04/17/14 1457 04/17/14 2255   04/17/14 0600  ceFAZolin (ANCEF) IVPB 2 g/50 mL premix     2 g 100 mL/hr over 30 Minutes Intravenous On call to O.R. 04/16/14 1334 04/17/14 1000       Patient was given sequential compression devices, early ambulation, and chemoprophylaxis to prevent DVT.  Patient benefited maximally from hospital stay and there were no complications.    Recent vital signs:  Patient Vitals for the past 24 hrs:  BP Temp Pulse Resp SpO2  04/19/14 0547 110/62 mmHg  98.5 F (36.9 C) 79 19 99 %  04/19/14 0400 - - - 19 -  04/19/14 0000 - - - 18 -  04/18/14 2036 (!) 110/47 mmHg 97.9 F (36.6 C) 87 16 98 %  04/18/14 2000 - - - 18 -  04/18/14 1300 (!) 126/54 mmHg 98.6 F (37 C) 87 16 96 %  04/18/14 0930 (!) 104/53 mmHg - - - -  04/18/14 0927 (!) 101/56 mmHg - - - -  04/18/14 0924 (!) 93/51 mmHg - - - -     Recent laboratory studies:   Recent Labs  04/18/14 0732  WBC 7.3  HGB 10.0*  HCT 30.7*  PLT 274  NA 137  K 3.9  CL 105  CO2 28  BUN <5*  CREATININE 0.59  GLUCOSE 100*  CALCIUM 7.8*     Discharge Medications:     Medication List    STOP taking these medications        acetaminophen 325 MG tablet  Commonly known as:  TYLENOL     ibuprofen 600 MG tablet  Commonly known as:  ADVIL,MOTRIN      TAKE these medications        apixaban 2.5 MG Tabs tablet  Commonly known as:  ELIQUIS  Take 1 tab q12 hours x 12 days following surgery to prevent blood clots     bisacodyl 5 MG EC tablet  Commonly known as:  DULCOLAX  Take 1 tablet (5 mg total) by mouth daily as needed for moderate constipation.     methocarbamol 500 MG tablet  Commonly known as:  ROBAXIN  Take 1 tablet (500 mg total) by mouth 4 (four) times daily.     multivitamin with minerals tablet  Take 1 tablet by mouth daily.     ondansetron 4 MG tablet  Commonly known as:  ZOFRAN  Take 1 tablet (4 mg total) by mouth every 8 (eight) hours as needed for nausea or vomiting.     oxyCODONE-acetaminophen 5-325 MG per tablet  Commonly known as:  ROXICET  Take 1-2 tablets by mouth every 4 (four) hours as needed.     RABEprazole 20 MG tablet  Commonly known as:  ACIPHEX  Take 20 mg by mouth daily.     VIVELLE-DOT 0.05 MG/24HR patch  Generic drug:  estradiol  Place 1 patch onto the skin 2 (two) times a week.        Diagnostic Studies: Dg Knee Left Port  04/17/2014   CLINICAL DATA:  Postop left knee arthroplasty.  EXAM: PORTABLE LEFT KNEE - 1-2 VIEW  COMPARISON:   None.  FINDINGS: The patient is status post left total knee arthroplasty. The hardware components are in anatomic alignment and there is no complication. Surgical drain overlies the femur. There is gas in the surrounding soft tissues.  IMPRESSION: 1. Status post left total knee arthroplasty.  No complications.   Electronically Signed   By: Signa Kellaylor  Stroud M.D.   On: 04/17/2014 16:46    Disposition: Final discharge disposition not confirmed  Discharge Instructions    CPM    Complete by:  As directed   Continuous passive motion machine (CPM):      Use the CPM from 0- to 60 for 6 hours per day.      You may increase by 10 per day.  You may break it up into 2 or 3 sessions per day.      Use CPM for 2-3 weeks or until you are told to stop.     Call MD / Call 911    Complete by:  As directed   If you experience chest pain or shortness of breath, CALL 911 and be transported to the hospital emergency room.  If you develope a fever above 101 F, pus (white drainage) or increased drainage or redness at the wound, or calf pain, call your surgeon's office.     Change dressing    Complete by:  As directed   Change dressing on saturday, then change the dressing daily with sterile 4 x 4 inch gauze dressing and apply TED hose.  You may clean the incision with alcohol prior to redressing.     Constipation Prevention    Complete by:  As directed   Drink plenty of fluids.  Prune juice may be helpful.  You may use a stool softener, such as Colace (over the counter) 100 mg twice a day.  Use MiraLax (over the counter) for constipation as needed.     Diet - low sodium heart healthy    Complete by:  As directed      Discharge instructions    Complete by:  As directed   Weight bearing as tolerated.  Take Eliquis as directed for a total of 12 days following surgery to prevent blood clots (this prescription was sent in to your pharmacy approximately two weeks ago).  Change bandage daily starting on Saturday.  May shower  on  Monday but do not soak incision.  May apply ice for up to 20 minutes at a time for pain and swelling.  Follow up appointment in two weeks.     Do not put a pillow under the knee. Place it under the heel.    Complete by:  As directed   Place gray foam under operative heel when in bed or in a chair to work on extension     Increase activity slowly as tolerated    Complete by:  As directed      TED hose    Complete by:  As directed   Use stockings (TED hose) for 2 weeks on both leg(s).  You may remove them at night for sleeping.           Follow-up Information    Follow up with [facility], MD. Schedule an appointment as soon as possible for a visit in 2 weeks.   Specialty:  Orthopedic Surgery   Contact information:   [address] [place] [address] [phone]       Follow up with [facility]   Why:  They will contact you to schedule home therapy visits.    Contact information:   [address] [place] [address] [phone]        Signed: Loni Dolly. Mardella Layman 04/19/2014, 6:55 AM

## 2014-04-17 NOTE — Anesthesia Postprocedure Evaluation (Signed)
  Anesthesia Post-op Note  Patient: Ariel LowerKathy L Ruffini  Procedure(s) Performed: Procedure(s): TOTAL KNEE ARTHROPLASTY (Left)  Patient Location: PACU  Anesthesia Type:General  Level of Consciousness: awake and alert   Airway and Oxygen Therapy: Patient Spontanous Breathing  Post-op Pain: none  Post-op Assessment: Post-op Vital signs reviewed  Post-op Vital Signs: Reviewed  Last Vitals:  Filed Vitals:   04/17/14 1441  BP: 125/55  Pulse: 66  Temp: 36.4 C  Resp: 12    Complications: No apparent anesthesia complications

## 2014-04-17 NOTE — Interval H&P Note (Signed)
History and Physical Interval Note:  04/17/2014 8:28 AM  [name]  has presented today for surgery, with the diagnosis of djd left knee  The various methods of treatment have been discussed with the patient and family. After consideration of risks, benefits and other options for treatment, the patient has consented to  Procedure(s): TOTAL KNEE ARTHROPLASTY (Left) as a surgical intervention .  The patient's history has been reviewed, patient examined, no change in status, stable for surgery.  I have reviewed the patient's chart and labs.  Questions were answered to the patient's satisfaction.     Miroslav Gin F

## 2014-04-18 ENCOUNTER — Encounter (HOSPITAL_COMMUNITY): Payer: Self-pay | Admitting: Orthopedic Surgery

## 2014-04-18 LAB — URINALYSIS, ROUTINE W REFLEX MICROSCOPIC
Bilirubin Urine: NEGATIVE
Glucose, UA: NEGATIVE mg/dL
Hgb urine dipstick: NEGATIVE
KETONES UR: NEGATIVE mg/dL
Leukocytes, UA: NEGATIVE
NITRITE: NEGATIVE
PH: 6 (ref 5.0–8.0)
PROTEIN: NEGATIVE mg/dL
Specific Gravity, Urine: 1.014 (ref 1.005–1.030)
Urobilinogen, UA: 0.2 mg/dL (ref 0.0–1.0)

## 2014-04-18 LAB — CBC
HCT: 30.7 % — ABNORMAL LOW (ref 36.0–46.0)
HEMOGLOBIN: 10 g/dL — AB (ref 12.0–15.0)
MCH: 27.2 pg (ref 26.0–34.0)
MCHC: 32.6 g/dL (ref 30.0–36.0)
MCV: 83.4 fL (ref 78.0–100.0)
Platelets: 274 10*3/uL (ref 150–400)
RBC: 3.68 MIL/uL — ABNORMAL LOW (ref 3.87–5.11)
RDW: 14.4 % (ref 11.5–15.5)
WBC: 7.3 10*3/uL (ref 4.0–10.5)

## 2014-04-18 LAB — BASIC METABOLIC PANEL
Anion gap: 4 — ABNORMAL LOW (ref 5–15)
BUN: <5 mg/dL — ABNORMAL LOW (ref 6–23)
CO2: 28 mmol/L (ref 19–32)
CREATININE: 0.59 mg/dL (ref 0.50–1.10)
Calcium: 7.8 mg/dL — ABNORMAL LOW (ref 8.4–10.5)
Chloride: 105 mmol/L (ref 96–112)
GFR calc Af Amer: >90 mL/min (ref >90)
GFR calc non Af Amer: >90 mL/min (ref >90)
Glucose, Bld: 100 mg/dL — ABNORMAL HIGH (ref 70–99)
POTASSIUM: 3.9 mmol/L (ref 3.5–5.1)
Sodium: 137 mmol/L (ref 135–145)

## 2014-04-18 MED ORDER — APIXABAN 2.5 MG PO TABS: ORAL_TABLET | ORAL | Status: DC

## 2014-04-18 NOTE — Progress Notes (Signed)
04[facility]18/14 [facility]. Spoke with patient, no change in discharge plan.Patient stated that her family will be able to assist her after d/c.Spoke with Kipp BroodBrent from [employer], they will be providing CPM and 3N1, patient already has a rolling walker.Will continue to follow until discharge.
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
## 2014-04-18 NOTE — Progress Notes (Signed)
Subjective: 1 Day Post-Op Procedure(s) (LRB): TOTAL KNEE ARTHROPLASTY (Left) Patient reports pain as moderate.  Patient with increased nause since yesterday.  Ok over past few hours.  Moderated lightheadedness as well with a syncopal episode yesterday.    Objective: Vital signs in last 24 hours: Temp:  [97.5 F (36.4 C)-98.6 F (37 C)] 98.4 F (36.9 C) (02/18 0516) Pulse Rate:  [59-95] 81 (02/18 0516) Resp:  [9-24] 16 (02/18 0516) BP: (103-172)/(41-76) 105/44 mmHg (02/18 0516) SpO2:  [69 %-100 %] 99 % (02/18 0516) Weight:  [94.711 kg (208 lb 12.8 oz)] 94.711 kg (208 lb 12.8 oz) (02/17 0758)  Intake/Output from previous day: 02/17 0701 - 02/18 0700 In: 2065 [P.O.:360; I.V.:1650; IV Piggyback:55] Out: 575 [Urine:100; Drains:475] Intake/Output this shift:    No results for input(s): HGB in the last 72 hours. No results for input(s): WBC, RBC, HCT, PLT in the last 72 hours. No results for input(s): NA, K, CL, CO2, BUN, CREATININE, GLUCOSE, CALCIUM in the last 72 hours. No results for input(s): LABPT, INR in the last 72 hours.  Neurologically intact Neurovascular intact Sensation intact distally Intact pulses distally Dorsiflexion/Plantar flexion intact Compartment soft  hemovac drain pulled by me today Negative homans bilaterally  Assessment/Plan: 1 Day Post-Op Procedure(s) (LRB): TOTAL KNEE ARTHROPLASTY (Left) Advance diet Up with therapy D/C IV fluids Discharge home with home health if pain and nausea under control and if she works well with PT WBAT LLE Dry dressing change prn   Dub MikesStanbery, M. Mardella LaymanLindsey 04/18/2014, 7:15 AM
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
## 2014-04-18 NOTE — Op Note (Signed)
NAMGenella Rife:  [name], [name]               ACCOUNT NO.:  [id number]  MEDICAL RECORD NO.:  [id number]  LOCATION:  5N30C                        FACILITY:  [facility]  PHYSICIAN:  Loreta Aveaniel F. Murphy, M.D. DATE OF BIRTH:  21-Mar-1965  DATE OF PROCEDURE:  04/17/2014 DATE OF DISCHARGE:                              OPERATIVE REPORT   PREOPERATIVE DIAGNOSIS:  Left knee end-stage arthritis, primary, localized.  POSTOPERATIVE DIAGNOSIS:  Left knee end-stage arthritis, primary, localized.  PROCEDURE:  Modified minimally invasive left total knee replacement, Stryker Triathlon prosthesis.  Soft tissue balancing.  Cemented pegged cruciate retained #4 femoral component.  Cemented #4 tibial component, 9 mm CS insert.  Cemented resurfacing 32 mm patellar component.  SURGEON:  Loreta Aveaniel F. Murphy, M.D.  ASSISTANT:  Odelia GageLindsey Anton, PA, present throughout the entire case and necessary for timely completion of procedure.  ANESTHESIA:  General.  ESTIMATED BLOOD LOSS:  Minimal.  SPECIMENS:  None.  CULTURES:  None.  COMPLICATIONS:  None.  DRESSINGS:  Soft compressive knee immobilizer.  DRAINS:  Hemovac x1.  TOURNIQUET TIME:  1 hour.  DESCRIPTION OF PROCEDURE:  The patient brought to the operating room, placed on the operating table in supine position.  After adequate anesthesia had been obtained, tourniquet applied.  Prepped and draped in usual sterile fashion.  Exsanguinated with elevation of Esmarch. Tourniquet inflated to 350 mmHg.  Straight incision above the patella down the tibial tubercle.  Skin and subcutaneous tissue divided.  Medial arthrotomy, vastus splitting, preserving quad tendon.  Medial capsule released.  Distal femur exposed.  Flexible intramedullary guide.  An 8 mm resection, 5 degrees of valgus.  Using epicondylar axis, the femur was sized, cut, and fitted for a pegged cruciate retained #4 component. Proximal tibial resection, extramedullary guide.  A 3-degree posterior slope cut.  Size  #4 component.  Debris cleared throughout.  Nicely balanced in flexion extension.  Patella exposed, posterior 10 mm removed.  Drilled, sized, and fitted for a 32-mm component.  Knee irrigated.  Trials put in place.  9 mm CS insert.  Nicely balanced knee, full motion, good stability, good patellar tracking confirmed.  Tibia was marked for rotation and hand reamed.  All trials removed.  Copious irrigation with a pulse irrigating device.  Cement prepared, placed on all components, firmly seated.  Polyethylene attached to tibia, knee reduced.  Patella was cemented in place, held with a clamp.  Once cement hardened, the knee was irrigated again.  Soft tissues injected with Exparel.  Hemovac placed.  Arthrotomy closed with #1 Vicryl with skin and subcutaneous closure.  Margins were injected with Marcaine.  Sterile compressive dressing applied.  Tourniquet deflated, removed.  Knee immobilizer applied.  Anesthesia reversed.  Brought to the recovery room.  Tolerated the surgery well.  No complications.     Loreta Aveaniel F. Murphy, M.D.     DFM/MEDQ  D:  04/17/2014  T:  04/18/2014  Job:  [id number]

## 2014-04-18 NOTE — Progress Notes (Signed)
PT Cancellation Note  Patient Details Name: Macky LowerKathy L Shanafelt MRN: [id number] DOB: 06/29/1965   Cancelled Treatment:    Reason Eval/Treat Not Completed: Other (comment) .  Just finishing with OT.  PT to check back just before lunch.   Thanks,   Rollene Rotundaebecca B. Kainoa Swoboda, PT, DPT [phone]   04/18/2014, 9:44 AM
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
## 2014-04-18 NOTE — Discharge Instructions (Signed)
Total Knee Replacement, Care After Refer to this sheet in the next few weeks. These instructions provide you with information on caring for yourself after your procedure. Your health care provider also may give you specific instructions. Your treatment has been planned according to the most current medical practices, but problems sometimes occur. Call your health care provider if you have any problems or questions after your procedure. HOME CARE INSTRUCTIONS   Weight bearing as tolerated.  Take Eliquis as directed for a total of 12 days following surgery to prevent blood clots (this prescription was sent in to your pharmacy approximately two weeks ago).  Change bandage daily starting on Saturday.  May shower on Monday but do not soak incision.  May apply ice for up to 20 minutes at a time for pain and swelling.  Follow up appointment in two weeks.   See a physical therapist as directed by your health care provider.  Take medicines only as directed by your health care provider.  Avoid lifting or driving until you are instructed otherwise.  If you have been sent home with a continuous passive motion machine, use it as directed by your health care provider. SEEK MEDICAL CARE IF:  You have difficulty breathing.  You have drainage, redness, swelling, or pain at your incision site.  You have a bad smell coming from your incision site.  You have persistent bleeding from your incision site.  Your incision breaks open after sutures (stitches) or staples have been removed.  You have a fever. SEEK IMMEDIATE MEDICAL CARE IF:   You have a rash.  You have pain or swelling in your calf or thigh.  You have shortness of breath or chest pain.  Your range of motion in your knee is decreasing rather than increasing. MAKE SURE YOU:   Understand these instructions.  Will watch your condition.  Will get help right away if you are not doing well or get worse. Document Released: 09/04/2004 Document  Revised: 07/02/2013 Document Reviewed: 04/06/2011 [facility] Patient Information 2015 [employer] [place]. This information is not intended to replace advice given to you by your health care provider. Make sure you discuss any questions you have with your health care provider.  Information on my medicine - ELIQUIS (apixaban)  Why was Eliquis prescribed for you? Eliquis was prescribed for you to reduce the risk of blood clots forming after orthopedic surgery.    What do You need to know about Eliquis? Take your Eliquis TWICE DAILY - one tablet in the morning and one tablet in the evening with or without food.  It would be best to take the dose about the same time each day.  If you have difficulty swallowing the tablet whole please discuss with your pharmacist how to take the medication safely.  Take Eliquis exactly as prescribed by your doctor and DO NOT stop taking Eliquis without talking to the doctor who prescribed the medication.  Stopping without other medication to take the place of Eliquis may increase your risk of developing a clot.  After discharge, you should have regular check-up appointments with your healthcare provider that is prescribing your Eliquis.  What do you do if you miss a dose? If a dose of ELIQUIS is not taken at the scheduled time, take it as soon as possible on the same day and twice-daily administration should be resumed.  The dose should not be doubled to make up for a missed dose.  Do not take more than one tablet of ELIQUIS at  the same time.  Important Safety Information A possible side effect of Eliquis is bleeding. You should call your healthcare provider right away if you experience any of the following: ? Bleeding from an injury or your nose that does not stop. ? Unusual colored urine (red or dark brown) or unusual colored stools (red or black). ? Unusual bruising for unknown reasons. ? A serious fall or if you hit your head (even if there is no  bleeding).  Some medicines may interact with Eliquis and might increase your risk of bleeding or clotting while on Eliquis. To help avoid this, consult your healthcare provider or pharmacist prior to using any new prescription or non-prescription medications, including herbals, vitamins, non-steroidal anti-inflammatory drugs (NSAIDs) and supplements.  This website has more information on Eliquis (apixaban): http://www.eliquis.com/eliquis/home

## 2014-04-19 LAB — BASIC METABOLIC PANEL
ANION GAP: 3 — AB (ref 5–15)
BUN: <5 mg/dL — ABNORMAL LOW (ref 6–23)
CO2: 30 mmol/L (ref 19–32)
Calcium: 8 mg/dL — ABNORMAL LOW (ref 8.4–10.5)
Chloride: 104 mmol/L (ref 96–112)
Creatinine, Ser: 0.62 mg/dL (ref 0.50–1.10)
GFR calc Af Amer: >90 mL/min (ref >90)
GFR calc non Af Amer: >90 mL/min (ref >90)
Glucose, Bld: 115 mg/dL — ABNORMAL HIGH (ref 70–99)
POTASSIUM: 3.7 mmol/L (ref 3.5–5.1)
Sodium: 137 mmol/L (ref 135–145)

## 2014-04-19 LAB — CBC
HEMATOCRIT: 29.3 % — AB (ref 36.0–46.0)
HEMOGLOBIN: 9.4 g/dL — AB (ref 12.0–15.0)
MCH: 26.6 pg (ref 26.0–34.0)
MCHC: 32.1 g/dL (ref 30.0–36.0)
MCV: 83 fL (ref 78.0–100.0)
Platelets: 257 10*3/uL (ref 150–400)
RBC: 3.53 MIL/uL — ABNORMAL LOW (ref 3.87–5.11)
RDW: 14.7 % (ref 11.5–15.5)
WBC: 7.3 10*3/uL (ref 4.0–10.5)

## 2014-04-19 NOTE — Progress Notes (Signed)
Subjective: 2 Days Post-Op Procedure(s) (LRB): TOTAL KNEE ARTHROPLASTY (Left) Patient reports pain as mild.  No nausea/vomiting, lightheadedness/dizziness.  Positive flatus but no bm.  Tolerating diet.  Acute urinary retention has resolved.  Objective: Vital signs in last 24 hours: Temp:  [97.9 F (36.6 C)-98.6 F (37 C)] 98.5 F (36.9 C) (02/19 0547) Pulse Rate:  [79-87] 79 (02/19 0547) Resp:  [16-19] 19 (02/19 0547) BP: (93-126)/(47-62) 110/62 mmHg (02/19 0547) SpO2:  [96 %-99 %] 99 % (02/19 0547)  Intake/Output from previous day: 02/18 0701 - 02/19 0700 In: 720 [P.O.:720] Out: -  Intake/Output this shift:     Recent Labs  04/18/14 0732 04/19/14 0645  HGB 10.0* 9.4*    Recent Labs  04/18/14 0732 04/19/14 0645  WBC 7.3 7.3  RBC 3.68* 3.53*  HCT 30.7* 29.3*  PLT 274 257    Recent Labs  04/18/14 0732 04/19/14 0645  NA 137 PENDING  K 3.9 3.7  CL 105 PENDING  CO2 28 PENDING  BUN <5* <5*  CREATININE 0.59 0.62  GLUCOSE 100* 115*  CALCIUM 7.8* 8.0*   No results for input(s): LABPT, INR in the last 72 hours.  Neurologically intact Neurovascular intact Sensation intact distally Intact pulses distally Dorsiflexion/Plantar flexion intact Incision: no drainage No cellulitis present Compartment soft  Dressing changed by me today  Assessment/Plan: 2 Days Post-Op Procedure(s) (LRB): TOTAL KNEE ARTHROPLASTY (Left) Advance diet Up with therapy Discharge home with home health after am PT session if patient does well enough WBAT LLE Dry dressing change prn Acute urinary retention from 04/18/14- U/A (negative).  Appears to have resolved  [name], [name] 04/19/2014, 7:51 AM
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
## 2014-04-19 NOTE — Progress Notes (Signed)
Discharge instructions and prescriptions reviewed with patient. Patient denies questions or concerns at this time. Patient has all necessary equipment delivered to home and [facility] VS stable. Patient discharged via wheelchair with all personal belongings, discharge packet, and prescriptions.

## 2014-06-11 ENCOUNTER — Other Ambulatory Visit: Payer: Self-pay | Admitting: Physician Assistant

## 2014-06-11 NOTE — H&P (Signed)
TOTAL KNEE ADMISSION H&P  Patient is being admitted for right total knee arthroplasty.  Subjective:  Chief Complaint:right knee pain.  HPI: [name], 48 y.o. female, has a history of pain and functional disability in the right knee due to arthritis and has failed non-surgical conservative treatments for greater than 12 weeks to includeNSAID's and/or analgesics, corticosteriod injections and activity modification.  Onset of symptoms was gradual, starting 5 years ago with gradually worsening course since that time. The patient noted prior procedures on the knee to include  arthroscopy, menisectomy and ACL reconstruction on the right knee(s).  Patient currently rates pain in the right knee(s) at 8 out of 10 with activity. Patient has night pain, worsening of pain with activity and weight bearing, pain that interferes with activities of daily living, pain with passive range of motion, crepitus and joint swelling.  Patient has evidence of subchondral sclerosis, periarticular osteophytes and joint space narrowing by imaging studies. There is no active infection.  Patient Active Problem List   Diagnosis Date Noted  . DJD (degenerative joint disease) of knee 04/17/2014   Past Medical History  Diagnosis Date  . Complication of anesthesia     Pt hands and feet itch after having anesthesia from too much fluid  . PONV (postoperative nausea and vomiting)   . Pneumonia     hx of  . GERD (gastroesophageal reflux disease)   . DJD (degenerative joint disease) of knee     Past Surgical History  Procedure Laterality Date  . Knee surgery Right   . Tonsillectomy    . Appendectomy    . Colonoscopy    . Breast enhancement surgery    . Abdominal hysterectomy  1991  . Venous ablation Bilateral     Vein ablations  . Total knee arthroplasty Left 04/17/2014    Procedure: TOTAL KNEE ARTHROPLASTY;  Surgeon: Daniel F Murphy, MD;  Location: [facility] OR;  Service: Orthopedics;  Laterality: Left;     (Not in a  hospital admission) No Known Allergies  History  Substance Use Topics  . Smoking status: Never Smoker   . Smokeless tobacco: Never Used  . Alcohol Use: No    No family history on file.   Review of Systems  Constitutional: Negative.   HENT: Negative.   Eyes: Negative.   Respiratory: Negative.   Cardiovascular: Negative.   Gastrointestinal: Negative.   Genitourinary: Negative.   Musculoskeletal: Positive for joint pain.  Skin: Negative.   Neurological: Negative.   Endo/Heme/Allergies: Negative.   Psychiatric/Behavioral: Negative.     Objective:  Physical Exam  Constitutional: She is oriented to person, place, and time. She appears well-developed and well-nourished.  HENT:  Head: Normocephalic and atraumatic.  Eyes: EOM are normal. Pupils are equal, round, and reactive to light.  Neck: Normal range of motion. Neck supple.  Cardiovascular: Normal rate, regular rhythm and normal heart sounds.   Respiratory: Effort normal and breath sounds normal. No respiratory distress. She has no wheezes. She has no rales.  GI: Soft. Bowel sounds are normal.  Musculoskeletal:  Examination of her right knee reveals 1+ effusion.  Medial and lateral joint line tenderness to palpation.  Moderate patellofemoral crepitus.  Marked tenderness and swelling in the pes bursa.  Range of motion 0-100 degrees.  She is neurovascularly intact distally.     Neurological: She is alert and oriented to person, place, and time.  Skin: Skin is warm and dry.  Psychiatric: She has a normal mood and affect. Her behavior is   normal. Judgment and thought content normal.    Vital signs in last 24 hours: @VSRANGES@  Labs:   Estimated body mass index is 34.75 kg/(m^2) as calculated from the following:   Height as of 04/19/14: 5' 5" (1.651 m).   Weight as of 04/04/14: 94.711 kg (208 lb 12.8 oz).   Imaging Review Plain radiographs demonstrate severe degenerative joint disease of the right knee(s). The overall  alignment isneutral. The bone quality appears to be fair for age and reported activity level.  Assessment/Plan:  End stage arthritis, right knee   The patient history, physical examination, clinical judgment of the provider and imaging studies are consistent with end stage degenerative joint disease of the right knee(s) and total knee arthroplasty is deemed medically necessary. The treatment options including medical management, injection therapy arthroscopy and arthroplasty were discussed at length. The risks and benefits of total knee arthroplasty were presented and reviewed. The risks due to aseptic loosening, infection, stiffness, patella tracking problems, thromboembolic complications and other imponderables were discussed. The patient acknowledged the explanation, agreed to proceed with the plan and consent was signed. Patient is being admitted for inpatient treatment for surgery, pain control, PT, OT, prophylactic antibiotics, VTE prophylaxis, progressive ambulation and ADL's and discharge planning. The patient is planning to be discharged home with home health services   

## 2014-06-14 ENCOUNTER — Encounter (HOSPITAL_COMMUNITY)
Admission: RE | Admit: 2014-06-14 | Discharge: 2014-06-14 | Disposition: A | Discharge status: Home / Self Care | Payer: BLUE CROSS/BLUE SHIELD | Source: Ambulatory Visit | Attending: Orthopedic Surgery | Admitting: Orthopedic Surgery

## 2014-06-14 DIAGNOSIS — Z01812 Encounter for preprocedural laboratory examination: Secondary | ICD-10-CM | POA: Insufficient documentation

## 2014-06-14 LAB — COMPREHENSIVE METABOLIC PANEL
ALBUMIN: 4.2 g/dL (ref 3.5–5.2)
ALK PHOS: 87 U/L (ref 39–117)
ALT: 18 U/L (ref 0–35)
ANION GAP: 11 (ref 5–15)
AST: 19 U/L (ref 0–37)
BUN: 14 mg/dL (ref 6–23)
CO2: 24 mmol/L (ref 19–32)
Calcium: 9.6 mg/dL (ref 8.4–10.5)
Chloride: 103 mmol/L (ref 96–112)
Creatinine, Ser: 0.63 mg/dL (ref 0.50–1.10)
GFR calc Af Amer: >90 mL/min (ref >90)
GFR calc non Af Amer: >90 mL/min (ref >90)
GLUCOSE: 107 mg/dL — AB (ref 70–99)
POTASSIUM: 4 mmol/L (ref 3.5–5.1)
Sodium: 138 mmol/L (ref 135–145)
Total Bilirubin: 0.3 mg/dL (ref 0.3–1.2)
Total Protein: 7.2 g/dL (ref 6.0–8.3)

## 2014-06-14 LAB — TYPE AND SCREEN
ABO/RH(D): O POS
ANTIBODY SCREEN: NEGATIVE

## 2014-06-14 LAB — CBC WITH DIFFERENTIAL/PLATELET
BASOS ABS: 0 10*3/uL (ref 0.0–0.1)
BASOS PCT: 1 % (ref 0–1)
Eosinophils Absolute: 0.1 10*3/uL (ref 0.0–0.7)
Eosinophils Relative: 2 % (ref 0–5)
HCT: 41 % (ref 36.0–46.0)
HEMOGLOBIN: 13.4 g/dL (ref 12.0–15.0)
LYMPHS PCT: 29 % (ref 12–46)
Lymphs Abs: 2 10*3/uL (ref 0.7–4.0)
MCH: 27.1 pg (ref 26.0–34.0)
MCHC: 32.7 g/dL (ref 30.0–36.0)
MCV: 83 fL (ref 78.0–100.0)
Monocytes Absolute: 0.5 10*3/uL (ref 0.1–1.0)
Monocytes Relative: 7 % (ref 3–12)
NEUTROS ABS: 4.1 10*3/uL (ref 1.7–7.7)
Neutrophils Relative %: 61 % (ref 43–77)
Platelets: 364 10*3/uL (ref 150–400)
RBC: 4.94 MIL/uL (ref 3.87–5.11)
RDW: 13.8 % (ref 11.5–15.5)
WBC: 6.7 10*3/uL (ref 4.0–10.5)

## 2014-06-14 LAB — URINALYSIS, ROUTINE W REFLEX MICROSCOPIC
BILIRUBIN URINE: NEGATIVE
GLUCOSE, UA: NEGATIVE mg/dL
Hgb urine dipstick: NEGATIVE
Ketones, ur: NEGATIVE mg/dL
Leukocytes, UA: NEGATIVE
Nitrite: NEGATIVE
PH: 6.5 (ref 5.0–8.0)
Protein, ur: NEGATIVE mg/dL
Specific Gravity, Urine: 1.014 (ref 1.005–1.030)
Urobilinogen, UA: 0.2 mg/dL (ref 0.0–1.0)

## 2014-06-14 LAB — PROTIME-INR
INR: 0.94 (ref 0.00–1.49)
PROTHROMBIN TIME: 12.7 s (ref 11.6–15.2)

## 2014-06-14 LAB — APTT: aPTT: 30 seconds (ref 24–37)

## 2014-06-14 LAB — SURGICAL PCR SCREEN
MRSA, PCR: NEGATIVE
Staphylococcus aureus: NEGATIVE

## 2014-06-14 MED ORDER — CHLORHEXIDINE GLUCONATE 4 % EX LIQD: 60.0000 mL | Freq: Once | CUTANEOUS | Status: DC

## 2014-06-14 NOTE — Pre-Procedure Instructions (Signed)
[name]  06/14/2014   Your procedure is scheduled on:  June 26, 2014  [facility].  Call this number if you have problems the morning of surgery: [phone]   Remember:   Do not eat food or drink liquids after midnight.   Take these medicines the morning of surgery with A SIP OF WATER: methocarbamol (ROBAXIN),  RABEprazole (ACIPHEX), pain pill if needed.   STOP CELEBREX, HERBAL SUPPLEMENTS, VITAMIN E 7 DAYS PRIOR TO SURGERY   Do not wear jewelry, make-up or nail polish.  Do not wear lotions, powders, or perfumes. You may wear deodorant.  Do not shave 48 hours prior to surgery.   Do not bring valuables to the hospital.  [facility]    for any belongings or valuables.               Contacts, dentures or bridgework may not be worn into surgery.  Leave suitcase in the car. After surgery it may be brought to your room.  For patients admitted to the hospital, discharge time is determined by your treatment team.          Name and phone number of your driver:   Special Instructions: "PREPARING FOR SURGERY"   Please read over the following fact sheets that you were given: Pain Booklet, Coughing and Deep Breathing, Blood Transfusion Information and Surgical Site Infection Prevention

## 2014-06-15 LAB — URINE CULTURE

## 2014-06-25 MED ORDER — CEFAZOLIN SODIUM-DEXTROSE 2-3 GM-% IV SOLR
2.0000 g | INTRAVENOUS | Status: AC
  Administered 2014-06-26: 2 g via INTRAVENOUS
  Filled 2014-06-25: qty 50

## 2014-06-25 MED ORDER — LACTATED RINGERS IV SOLN: INTRAVENOUS | Status: DC

## 2014-06-26 ENCOUNTER — Inpatient Hospital Stay (HOSPITAL_COMMUNITY): Payer: BLUE CROSS/BLUE SHIELD | Admitting: Anesthesiology

## 2014-06-26 ENCOUNTER — Inpatient Hospital Stay (HOSPITAL_COMMUNITY)
Admission: RE | Admit: 2014-06-26 | Discharge: 2014-06-28 | DRG: 470 | Disposition: A | Discharge status: Home Health | Payer: BLUE CROSS/BLUE SHIELD | Source: Ambulatory Visit | Attending: Orthopedic Surgery | Admitting: Orthopedic Surgery

## 2014-06-26 ENCOUNTER — Inpatient Hospital Stay (HOSPITAL_COMMUNITY): Payer: BLUE CROSS/BLUE SHIELD

## 2014-06-26 ENCOUNTER — Encounter (HOSPITAL_COMMUNITY)
Admission: RE | Disposition: A | Discharge status: Home Health | Payer: Self-pay | Source: Ambulatory Visit | Attending: Orthopedic Surgery

## 2014-06-26 ENCOUNTER — Encounter (HOSPITAL_COMMUNITY): Payer: Self-pay | Admitting: *Deleted

## 2014-06-26 DIAGNOSIS — R42 Dizziness and giddiness: Secondary | ICD-10-CM | POA: Diagnosis not present

## 2014-06-26 DIAGNOSIS — Z96659 Presence of unspecified artificial knee joint: Secondary | ICD-10-CM

## 2014-06-26 DIAGNOSIS — Z79899 Other long term (current) drug therapy: Secondary | ICD-10-CM

## 2014-06-26 DIAGNOSIS — Z7902 Long term (current) use of antithrombotics/antiplatelets: Secondary | ICD-10-CM | POA: Diagnosis not present

## 2014-06-26 DIAGNOSIS — M1711 Unilateral primary osteoarthritis, right knee: Principal | ICD-10-CM | POA: Diagnosis present

## 2014-06-26 DIAGNOSIS — Z96652 Presence of left artificial knee joint: Secondary | ICD-10-CM | POA: Diagnosis present

## 2014-06-26 DIAGNOSIS — K219 Gastro-esophageal reflux disease without esophagitis: Secondary | ICD-10-CM | POA: Diagnosis present

## 2014-06-26 DIAGNOSIS — M25561 Pain in right knee: Secondary | ICD-10-CM | POA: Diagnosis present

## 2014-06-26 DIAGNOSIS — M1710 Unilateral primary osteoarthritis, unspecified knee: Secondary | ICD-10-CM | POA: Diagnosis present

## 2014-06-26 DIAGNOSIS — R110 Nausea: Secondary | ICD-10-CM | POA: Diagnosis not present

## 2014-06-26 DIAGNOSIS — Z96651 Presence of right artificial knee joint: Secondary | ICD-10-CM

## 2014-06-26 DIAGNOSIS — D62 Acute posthemorrhagic anemia: Secondary | ICD-10-CM | POA: Diagnosis not present

## 2014-06-26 DIAGNOSIS — M179 Osteoarthritis of knee, unspecified: Secondary | ICD-10-CM | POA: Diagnosis present

## 2014-06-26 HISTORY — PX: TOTAL KNEE ARTHROPLASTY: SHX125

## 2014-06-26 SURGERY — ARTHROPLASTY, KNEE, TOTAL
Anesthesia: General | Site: Knee | Laterality: Right

## 2014-06-26 MED ORDER — MEPERIDINE HCL 50 MG PO TABS: 50.0000 mg | ORAL_TABLET | ORAL | Status: DC | PRN

## 2014-06-26 MED ORDER — LIDOCAINE HCL (CARDIAC) 20 MG/ML IV SOLN
INTRAVENOUS | Status: DC | PRN
  Administered 2014-06-26: 80 mg via INTRAVENOUS

## 2014-06-26 MED ORDER — NEOSTIGMINE METHYLSULFATE 10 MG/10ML IV SOLN
INTRAVENOUS | Status: DC | PRN
  Administered 2014-06-26: 4 mg via INTRAVENOUS

## 2014-06-26 MED ORDER — PANTOPRAZOLE SODIUM 40 MG PO TBEC
40.0000 mg | DELAYED_RELEASE_TABLET | Freq: Every day | ORAL | Status: DC
  Administered 2014-06-27 – 2014-06-28 (×2): 40 mg via ORAL
  Filled 2014-06-26 (×2): qty 1

## 2014-06-26 MED ORDER — ESTRADIOL 0.05 MG/24HR TD PTTW: 0.0500 mg | MEDICATED_PATCH | TRANSDERMAL | Status: DC

## 2014-06-26 MED ORDER — GLYCOPYRROLATE 0.2 MG/ML IJ SOLN
INTRAMUSCULAR | Status: DC | PRN
  Administered 2014-06-26: .6 mg via INTRAVENOUS

## 2014-06-26 MED ORDER — ACETAMINOPHEN 325 MG PO TABS
650.0000 mg | ORAL_TABLET | Freq: Four times a day (QID) | ORAL | Status: DC | PRN
Start: 2014-06-26 — End: 2014-06-28
  Administered 2014-06-26: 650 mg via ORAL

## 2014-06-26 MED ORDER — FENTANYL CITRATE (PF) 250 MCG/5ML IJ SOLN
INTRAMUSCULAR | Status: AC
  Filled 2014-06-26: qty 5

## 2014-06-26 MED ORDER — CEFAZOLIN SODIUM-DEXTROSE 2-3 GM-% IV SOLR
2.0000 g | Freq: Four times a day (QID) | INTRAVENOUS | Status: AC
  Administered 2014-06-26 (×2): 2 g via INTRAVENOUS
  Filled 2014-06-26 (×2): qty 50

## 2014-06-26 MED ORDER — APIXABAN 2.5 MG PO TABS
2.5000 mg | ORAL_TABLET | Freq: Two times a day (BID) | ORAL | Status: DC
  Administered 2014-06-27 – 2014-06-28 (×3): 2.5 mg via ORAL
  Filled 2014-06-26 (×3): qty 1

## 2014-06-26 MED ORDER — PROPOFOL 10 MG/ML IV BOLUS
INTRAVENOUS | Status: DC | PRN
  Administered 2014-06-26: 160 mg via INTRAVENOUS

## 2014-06-26 MED ORDER — METHOCARBAMOL 500 MG PO TABS: 500.0000 mg | ORAL_TABLET | Freq: Four times a day (QID) | ORAL | Status: AC

## 2014-06-26 MED ORDER — NEOSTIGMINE METHYLSULFATE 10 MG/10ML IV SOLN
INTRAVENOUS | Status: AC
  Filled 2014-06-26: qty 1

## 2014-06-26 MED ORDER — ONDANSETRON HCL 4 MG PO TABS: 4.0000 mg | ORAL_TABLET | Freq: Three times a day (TID) | ORAL | Status: AC | PRN

## 2014-06-26 MED ORDER — HYDROMORPHONE HCL 1 MG/ML IJ SOLN
INTRAMUSCULAR | Status: AC
  Administered 2014-06-26: 0.5 mg via INTRAVENOUS
  Filled 2014-06-26: qty 2

## 2014-06-26 MED ORDER — PHENYLEPHRINE 40 MCG/ML (10ML) SYRINGE FOR IV PUSH (FOR BLOOD PRESSURE SUPPORT)
PREFILLED_SYRINGE | INTRAVENOUS | Status: AC
Start: 2014-06-26 — End: 2014-06-26
  Filled 2014-06-26: qty 40

## 2014-06-26 MED ORDER — ONDANSETRON HCL 4 MG/2ML IJ SOLN
INTRAMUSCULAR | Status: AC
  Filled 2014-06-26: qty 2

## 2014-06-26 MED ORDER — BISACODYL 5 MG PO TBEC: 5.0000 mg | DELAYED_RELEASE_TABLET | Freq: Every day | ORAL | Status: DC | PRN

## 2014-06-26 MED ORDER — LACTATED RINGERS IV SOLN
INTRAVENOUS | Status: DC
  Administered 2014-06-26: 10:00:00 via INTRAVENOUS

## 2014-06-26 MED ORDER — HYDROMORPHONE HCL 1 MG/ML IJ SOLN
0.5000 mg | INTRAMUSCULAR | Status: DC | PRN
  Administered 2014-06-26 – 2014-06-28 (×6): 1 mg via INTRAVENOUS
  Filled 2014-06-26 (×5): qty 1

## 2014-06-26 MED ORDER — METHOCARBAMOL 1000 MG/10ML IJ SOLN
500.0000 mg | Freq: Four times a day (QID) | INTRAVENOUS | Status: DC | PRN
  Filled 2014-06-26: qty 5

## 2014-06-26 MED ORDER — ACETAMINOPHEN 325 MG PO TABS
ORAL_TABLET | ORAL | Status: AC
  Administered 2014-06-26: 650 mg via ORAL
  Filled 2014-06-26: qty 2

## 2014-06-26 MED ORDER — HYDROMORPHONE HCL 1 MG/ML IJ SOLN
INTRAMUSCULAR | Status: AC
  Filled 2014-06-26: qty 1

## 2014-06-26 MED ORDER — CELECOXIB 200 MG PO CAPS
200.0000 mg | ORAL_CAPSULE | Freq: Two times a day (BID) | ORAL | Status: DC
  Administered 2014-06-26 – 2014-06-28 (×4): 200 mg via ORAL
  Filled 2014-06-26 (×4): qty 1

## 2014-06-26 MED ORDER — GLYCOPYRROLATE 0.2 MG/ML IJ SOLN
INTRAMUSCULAR | Status: AC
  Filled 2014-06-26: qty 3

## 2014-06-26 MED ORDER — MIDAZOLAM HCL 2 MG/2ML IJ SOLN
INTRAMUSCULAR | Status: AC
  Filled 2014-06-26: qty 2

## 2014-06-26 MED ORDER — ACETAMINOPHEN 650 MG RE SUPP: 650.0000 mg | Freq: Four times a day (QID) | RECTAL | Status: DC | PRN

## 2014-06-26 MED ORDER — APIXABAN 2.5 MG PO TABS: ORAL_TABLET | ORAL | Status: AC

## 2014-06-26 MED ORDER — BUPIVACAINE LIPOSOME 1.3 % IJ SUSP
20.0000 mL | INTRAMUSCULAR | Status: AC
  Administered 2014-06-26: 20 mL
  Filled 2014-06-26: qty 20

## 2014-06-26 MED ORDER — MIDAZOLAM HCL 2 MG/2ML IJ SOLN
INTRAMUSCULAR | Status: DC | PRN
  Administered 2014-06-26: 2 mg via INTRAVENOUS

## 2014-06-26 MED ORDER — PHENOL 1.4 % MT LIQD: 1.0000 | OROMUCOSAL | Status: DC | PRN

## 2014-06-26 MED ORDER — METHOCARBAMOL 500 MG PO TABS
500.0000 mg | ORAL_TABLET | Freq: Four times a day (QID) | ORAL | Status: DC | PRN
  Administered 2014-06-26 – 2014-06-28 (×6): 500 mg via ORAL
  Filled 2014-06-26 (×7): qty 1

## 2014-06-26 MED ORDER — PROMETHAZINE HCL 25 MG/ML IJ SOLN
INTRAMUSCULAR | Status: AC
  Administered 2014-06-26: 6.25 mg via INTRAVENOUS
  Filled 2014-06-26: qty 1

## 2014-06-26 MED ORDER — DEXAMETHASONE SODIUM PHOSPHATE 10 MG/ML IJ SOLN
INTRAMUSCULAR | Status: AC
  Filled 2014-06-26: qty 1

## 2014-06-26 MED ORDER — PHENYLEPHRINE HCL 10 MG/ML IJ SOLN
INTRAMUSCULAR | Status: DC | PRN
  Administered 2014-06-26 (×2): 80 ug via INTRAVENOUS

## 2014-06-26 MED ORDER — MEPERIDINE HCL 50 MG PO TABS
50.0000 mg | ORAL_TABLET | ORAL | Status: DC
  Filled 2014-06-26: qty 1

## 2014-06-26 MED ORDER — ROCURONIUM BROMIDE 50 MG/5ML IV SOLN
INTRAVENOUS | Status: AC
  Filled 2014-06-26: qty 1

## 2014-06-26 MED ORDER — SODIUM CHLORIDE 0.9 % IR SOLN
Status: DC | PRN
  Administered 2014-06-26: 1000 mL

## 2014-06-26 MED ORDER — POTASSIUM CHLORIDE IN NACL 20-0.9 MEQ/L-% IV SOLN
INTRAVENOUS | Status: DC
  Administered 2014-06-26: 19:00:00 via INTRAVENOUS
  Filled 2014-06-26 (×3): qty 1000

## 2014-06-26 MED ORDER — LORATADINE 10 MG PO TABS
10.0000 mg | ORAL_TABLET | Freq: Every day | ORAL | Status: DC
  Administered 2014-06-27 – 2014-06-28 (×2): 10 mg via ORAL
  Filled 2014-06-26 (×2): qty 1

## 2014-06-26 MED ORDER — ONDANSETRON HCL 4 MG/2ML IJ SOLN
INTRAMUSCULAR | Status: DC | PRN
  Administered 2014-06-26: 4 mg via INTRAVENOUS

## 2014-06-26 MED ORDER — PROPOFOL 10 MG/ML IV BOLUS
INTRAVENOUS | Status: AC
  Filled 2014-06-26: qty 20

## 2014-06-26 MED ORDER — HYDROMORPHONE HCL 1 MG/ML IJ SOLN
0.2500 mg | INTRAMUSCULAR | Status: DC | PRN
  Administered 2014-06-26: 0.25 mg via INTRAVENOUS
  Administered 2014-06-26: 0.5 mg via INTRAVENOUS
  Administered 2014-06-26: 0.25 mg via INTRAVENOUS
  Administered 2014-06-26 (×2): 0.5 mg via INTRAVENOUS

## 2014-06-26 MED ORDER — FLEET ENEMA 7-19 GM/118ML RE ENEM: 1.0000 | ENEMA | Freq: Once | RECTAL | Status: AC | PRN

## 2014-06-26 MED ORDER — HYDROMORPHONE HCL 2 MG PO TABS
2.0000 mg | ORAL_TABLET | ORAL | Status: DC | PRN
  Administered 2014-06-26 – 2014-06-27 (×3): 2 mg via ORAL
  Filled 2014-06-26 (×3): qty 1

## 2014-06-26 MED ORDER — SENNOSIDES-DOCUSATE SODIUM 8.6-50 MG PO TABS: 1.0000 | ORAL_TABLET | Freq: Every evening | ORAL | Status: DC | PRN

## 2014-06-26 MED ORDER — BISACODYL 5 MG PO TBEC: 5.0000 mg | DELAYED_RELEASE_TABLET | Freq: Every day | ORAL | Status: AC | PRN

## 2014-06-26 MED ORDER — MENTHOL 3 MG MT LOZG: 1.0000 | LOZENGE | OROMUCOSAL | Status: DC | PRN

## 2014-06-26 MED ORDER — ONDANSETRON HCL 4 MG/2ML IJ SOLN: 4.0000 mg | Freq: Four times a day (QID) | INTRAMUSCULAR | Status: DC | PRN

## 2014-06-26 MED ORDER — DIPHENHYDRAMINE HCL 12.5 MG/5ML PO ELIX: 12.5000 mg | ORAL_SOLUTION | ORAL | Status: DC | PRN

## 2014-06-26 MED ORDER — DOCUSATE SODIUM 100 MG PO CAPS
100.0000 mg | ORAL_CAPSULE | Freq: Two times a day (BID) | ORAL | Status: DC
  Administered 2014-06-26 – 2014-06-28 (×4): 100 mg via ORAL
  Filled 2014-06-26 (×4): qty 1

## 2014-06-26 MED ORDER — ONDANSETRON HCL 4 MG PO TABS: 4.0000 mg | ORAL_TABLET | Freq: Four times a day (QID) | ORAL | Status: DC | PRN

## 2014-06-26 MED ORDER — MEPERIDINE HCL 50 MG PO TABS: ORAL_TABLET | ORAL | Status: AC

## 2014-06-26 MED ORDER — ROCURONIUM BROMIDE 100 MG/10ML IV SOLN
INTRAVENOUS | Status: DC | PRN
  Administered 2014-06-26: 50 mg via INTRAVENOUS

## 2014-06-26 MED ORDER — BUPIVACAINE HCL 0.5 % IJ SOLN
INTRAMUSCULAR | Status: DC | PRN
  Administered 2014-06-26: 10 mL

## 2014-06-26 MED ORDER — METOCLOPRAMIDE HCL 5 MG PO TABS: 5.0000 mg | ORAL_TABLET | Freq: Three times a day (TID) | ORAL | Status: DC | PRN

## 2014-06-26 MED ORDER — PROMETHAZINE HCL 25 MG/ML IJ SOLN
6.2500 mg | INTRAMUSCULAR | Status: DC | PRN
  Administered 2014-06-26: 6.25 mg via INTRAVENOUS

## 2014-06-26 MED ORDER — FENTANYL CITRATE (PF) 100 MCG/2ML IJ SOLN
INTRAMUSCULAR | Status: DC | PRN
  Administered 2014-06-26: 50 ug via INTRAVENOUS
  Administered 2014-06-26: 100 ug via INTRAVENOUS
  Administered 2014-06-26: 50 ug via INTRAVENOUS
  Administered 2014-06-26: 100 ug via INTRAVENOUS
  Administered 2014-06-26: 50 ug via INTRAVENOUS

## 2014-06-26 MED ORDER — ZOLPIDEM TARTRATE 5 MG PO TABS: 5.0000 mg | ORAL_TABLET | Freq: Every evening | ORAL | Status: DC | PRN

## 2014-06-26 MED ORDER — DEXAMETHASONE SODIUM PHOSPHATE 10 MG/ML IJ SOLN
10.0000 mg | Freq: Once | INTRAMUSCULAR | Status: DC
Start: 2014-06-27 — End: 2014-06-26
  Administered 2014-06-26: 10 mg via INTRAVENOUS

## 2014-06-26 MED ORDER — HYDROMORPHONE HCL 1 MG/ML IJ SOLN
INTRAMUSCULAR | Status: DC | PRN
Start: 2014-06-26 — End: 2014-06-26
  Administered 2014-06-26 (×2): 0.5 mg via INTRAVENOUS

## 2014-06-26 MED ORDER — METOCLOPRAMIDE HCL 5 MG/ML IJ SOLN: 5.0000 mg | Freq: Three times a day (TID) | INTRAMUSCULAR | Status: DC | PRN

## 2014-06-26 MED ORDER — METHOCARBAMOL 1000 MG/10ML IJ SOLN
500.0000 mg | INTRAVENOUS | Status: AC
  Administered 2014-06-26: 500 mg via INTRAVENOUS
  Filled 2014-06-26: qty 5

## 2014-06-26 MED ORDER — VITAMIN E 180 MG (400 UNIT) PO CAPS
400.0000 [IU] | ORAL_CAPSULE | Freq: Every day | ORAL | Status: DC
  Administered 2014-06-28: 400 [IU] via ORAL
  Filled 2014-06-26 (×2): qty 1

## 2014-06-26 SURGICAL SUPPLY — 65 items
BANDAGE ELASTIC 4 VELCRO ST LF (GAUZE/BANDAGES/DRESSINGS) ×3 IMPLANT
BANDAGE ELASTIC 6 VELCRO ST LF (GAUZE/BANDAGES/DRESSINGS) ×3 IMPLANT
BANDAGE ESMARK 6X9 LF (GAUZE/BANDAGES/DRESSINGS) ×1 IMPLANT
BENZOIN TINCTURE PRP APPL 2/3 (GAUZE/BANDAGES/DRESSINGS) ×3 IMPLANT
BLADE SAG 18X100X1.27 (BLADE) ×6 IMPLANT
BNDG ESMARK 6X9 LF (GAUZE/BANDAGES/DRESSINGS) ×3
BOWL SMART MIX CTS (DISPOSABLE) ×3 IMPLANT
CAPT KNEE TOTAL 3 ×3 IMPLANT
CEMENT BONE SIMPLEX SPEEDSET (Cement) ×6 IMPLANT
CLOSURE WOUND 1/2 X4 (GAUZE/BANDAGES/DRESSINGS) ×2
COVER SURGICAL LIGHT HANDLE (MISCELLANEOUS) ×3 IMPLANT
CUFF TOURNIQUET SINGLE 34IN LL (TOURNIQUET CUFF) ×3 IMPLANT
DRAPE EXTREMITY T 121X128X90 (DRAPE) ×3 IMPLANT
DRAPE IMP U-DRAPE 54X76 (DRAPES) ×3 IMPLANT
DRAPE PROXIMA HALF (DRAPES) ×3 IMPLANT
DRAPE U-SHAPE 47X51 STRL (DRAPES) ×3 IMPLANT
DRSG PAD ABDOMINAL 8X10 ST (GAUZE/BANDAGES/DRESSINGS) ×3 IMPLANT
DURAPREP 26ML APPLICATOR (WOUND CARE) ×3 IMPLANT
ELECT CAUTERY BLADE 6.4 (BLADE) ×3 IMPLANT
ELECT REM PT RETURN 9FT ADLT (ELECTROSURGICAL) ×3
ELECTRODE REM PT RTRN 9FT ADLT (ELECTROSURGICAL) ×1 IMPLANT
EVACUATOR 1/8 PVC DRAIN (DRAIN) ×3 IMPLANT
FACESHIELD WRAPAROUND (MASK) ×6 IMPLANT
GAUZE SPONGE 4X4 12PLY STRL (GAUZE/BANDAGES/DRESSINGS) ×3 IMPLANT
GLOVE BIOGEL PI IND STRL 7.0 (GLOVE) ×3 IMPLANT
GLOVE BIOGEL PI INDICATOR 7.0 (GLOVE) ×6
GLOVE ECLIPSE 6.5 STRL STRAW (GLOVE) ×6 IMPLANT
GLOVE ECLIPSE 7.0 STRL STRAW (GLOVE) ×3 IMPLANT
GLOVE ORTHO TXT STRL SZ7.5 (GLOVE) ×3 IMPLANT
GLOVE SURG SS PI 8.0 STRL IVOR (GLOVE) ×6 IMPLANT
GOWN STRL REUS W/ TWL LRG LVL3 (GOWN DISPOSABLE) ×2 IMPLANT
GOWN STRL REUS W/ TWL XL LVL3 (GOWN DISPOSABLE) ×1 IMPLANT
GOWN STRL REUS W/TWL 2XL LVL3 (GOWN DISPOSABLE) ×3 IMPLANT
GOWN STRL REUS W/TWL LRG LVL3 (GOWN DISPOSABLE) ×4
GOWN STRL REUS W/TWL XL LVL3 (GOWN DISPOSABLE) ×2
HANDPIECE INTERPULSE COAX TIP (DISPOSABLE) ×2
IMMOBILIZER KNEE 22 UNIV (SOFTGOODS) ×3 IMPLANT
IMMOBILIZER KNEE 24 THIGH 36 (MISCELLANEOUS) IMPLANT
IMMOBILIZER KNEE 24 UNIV (MISCELLANEOUS)
KIT BASIN OR (CUSTOM PROCEDURE TRAY) ×3 IMPLANT
KIT ROOM TURNOVER OR (KITS) ×3 IMPLANT
MANIFOLD NEPTUNE II (INSTRUMENTS) ×3 IMPLANT
NEEDLE 18GX1X1/2 (RX/OR ONLY) (NEEDLE) ×3 IMPLANT
NEEDLE HYPO 25GX1X1/2 BEV (NEEDLE) ×3 IMPLANT
NS IRRIG 1000ML POUR BTL (IV SOLUTION) ×3 IMPLANT
PACK TOTAL JOINT (CUSTOM PROCEDURE TRAY) ×3 IMPLANT
PACK UNIVERSAL I (CUSTOM PROCEDURE TRAY) ×3 IMPLANT
PAD ARMBOARD 7.5X6 YLW CONV (MISCELLANEOUS) ×6 IMPLANT
PAD CAST 4YDX4 CTTN HI CHSV (CAST SUPPLIES) IMPLANT
PADDING CAST COTTON 4X4 STRL (CAST SUPPLIES)
PADDING CAST COTTON 6X4 STRL (CAST SUPPLIES) ×3 IMPLANT
SET HNDPC FAN SPRY TIP SCT (DISPOSABLE) ×1 IMPLANT
STRIP CLOSURE SKIN 1/2X4 (GAUZE/BANDAGES/DRESSINGS) ×4 IMPLANT
SUCTION FRAZIER TIP 10 FR DISP (SUCTIONS) ×3 IMPLANT
SUT MNCRL AB 4-0 PS2 18 (SUTURE) ×3 IMPLANT
SUT VIC AB 0 CT1 27 (SUTURE) ×2
SUT VIC AB 0 CT1 27XBRD ANBCTR (SUTURE) ×1 IMPLANT
SUT VIC AB 1 CT1 27 (SUTURE) ×6
SUT VIC AB 1 CT1 27XBRD ANBCTR (SUTURE) ×3 IMPLANT
SUT VIC AB 2-0 CT1 27 (SUTURE) ×8
SUT VIC AB 2-0 CT1 TAPERPNT 27 (SUTURE) ×4 IMPLANT
SYR 50ML LL SCALE MARK (SYRINGE) ×3 IMPLANT
SYR CONTROL 10ML LL (SYRINGE) ×3 IMPLANT
TOWEL OR 17X24 6PK STRL BLUE (TOWEL DISPOSABLE) ×3 IMPLANT
TOWEL OR 17X26 10 PK STRL BLUE (TOWEL DISPOSABLE) ×3 IMPLANT

## 2014-06-26 NOTE — Transfer of Care (Signed)
22Immediate Anesthesia Transfer of Care Note  Patient: [name]  Procedure(s) Performed: Procedure(s): RIGHT TOTAL KNEE ARTHROPLASTY (Right)  Patient Location: PACU  Anesthesia Type:General  Level of Consciousness: awake, alert  and oriented  Airway & Oxygen Therapy: Patient Spontanous Breathing and Patient connected to nasal cannula oxygen  Post-op Assessment: Report given to RN and Post -op Vital signs reviewed and stable  Post vital signs: Reviewed and stable  Last Vitals:  Filed Vitals:   06/26/14 0905  BP: 126/44  Pulse: 85  Temp: 36.5 C  Resp: 20    Complications: No apparent anesthesia complications

## 2014-06-26 NOTE — Discharge Summary (Addendum)
Patient ID: [name] MRN: [id number] DOB/AGE: [birth year] 49 y.o.  Admit date: 06/26/2014 Discharge date: 06/28/2014  Admission Diagnoses:  Active Problems:   DJD (degenerative joint disease) of knee   Discharge Diagnoses:  Same  Past Medical History  Diagnosis Date  . Complication of anesthesia     Pt hands and feet itch after having anesthesia from too much fluid  . PONV (postoperative nausea and vomiting)   . Pneumonia     hx of  . GERD (gastroesophageal reflux disease)   . DJD (degenerative joint disease) of knee     Surgeries: Procedure(s): RIGHT TOTAL KNEE ARTHROPLASTY on 06/26/2014   Consultants:    Discharged Condition: Improved  Hospital Course: [name] is an 49 y.o. female who was admitted 06/26/2014 for operative treatment of primary localized osteoarthritis right knee. Patient has severe unremitting pain that affects sleep, daily activities, and work/hobbies. After pre-op clearance the patient was taken to the operating room on 06/26/2014 and underwent  Procedure(s): RIGHT TOTAL KNEE ARTHROPLASTY.  Patient with a pre-op Hb of 13.4 developed ABLA on pod #1 with a Hb of 9.7 and 9.2 on pod#2. She is mildly symptomatic but stable.  We will continue to follow.  Patient was given perioperative antibiotics:      Anti-infectives    Start     Dose/Rate Route Frequency Ordered Stop   06/26/14 1800  ceFAZolin (ANCEF) IVPB 2 g/50 mL premix     2 g 100 mL/hr over 30 Minutes Intravenous Every 6 hours 06/26/14 1701 06/26/14 2343   06/26/14 0600  ceFAZolin (ANCEF) IVPB 2 g/50 mL premix     2 g 100 mL/hr over 30 Minutes Intravenous On call to O.R. 06/25/14 1436 06/26/14 1106       Patient was given sequential compression devices, early ambulation, and chemoprophylaxis to prevent DVT.  Patient benefited maximally from hospital stay and there were no complications.    Recent vital signs:  Patient Vitals for the past 24 hrs:  BP Temp Temp src Pulse Resp SpO2   06/28/14 0658 (!) 122/58 mmHg 98.4 F (36.9 C) Oral 90 16 94 %  06/28/14 0400 - - - - 18 100 %  06/28/14 0000 - - - - 16 98 %  06/27/14 2134 (!) 121/47 mmHg 98.1 F (36.7 C) Oral 92 16 95 %  06/27/14 2000 - - - - 16 95 %  06/27/14 1300 (!) 121/57 mmHg 98.1 F (36.7 C) - 85 18 96 %     Recent laboratory studies:   Recent Labs  06/27/14 0645 06/28/14 0438  WBC 8.2 6.5  HGB 9.7* 9.2*  HCT 29.7* 28.7*  PLT 321 317  NA 137 136  K 4.0 3.8  CL 103 101  CO2 24 28  BUN <5* <5*  CREATININE 0.57 0.59  GLUCOSE 117* 117*  CALCIUM 8.2* 8.0*     Discharge Medications:     Medication List    STOP taking these medications        acetaminophen 500 MG tablet  Commonly known as:  TYLENOL     oxyCODONE-acetaminophen 5-325 MG per tablet  Commonly known as:  ROXICET     traMADol 50 MG tablet  Commonly known as:  ULTRAM      TAKE these medications        apixaban 2.5 MG Tabs tablet  Commonly known as:  ELIQUIS  Take 1 tab po q12 hours x 12 days following surgery to prevent blood clots  bisacodyl 5 MG EC tablet  Commonly known as:  DULCOLAX  Take 1 tablet (5 mg total) by mouth daily as needed for moderate constipation.     CALCIUM + D PO  Take 2 tablets by mouth daily.     celecoxib 200 MG capsule  Commonly known as:  CELEBREX  Take 200 mg by mouth 2 (two) times daily with a meal.     estradiol 0.05 MG/24HR patch  Commonly known as:  VIVELLE-DOT  Place 1 patch onto the skin every 3 (three) days.     fexofenadine 180 MG tablet  Commonly known as:  ALLEGRA  Take 180 mg by mouth daily.     meperidine 50 MG tablet  Commonly known as:  DEMEROL  Take 1-2 tabs q4-6 hours prn pain     methocarbamol 500 MG tablet  Commonly known as:  ROBAXIN  Take 1 tablet (500 mg total) by mouth 4 (four) times daily.     multivitamin with minerals tablet  Take 1 tablet by mouth daily.     ondansetron 4 MG tablet  Commonly known as:  ZOFRAN  Take 1 tablet (4 mg total) by mouth  every 8 (eight) hours as needed for nausea or vomiting.     RABEprazole 20 MG tablet  Commonly known as:  ACIPHEX  Take 20 mg by mouth daily.     Sodium Fluoride 1.1 % Pste  Place 1 application onto teeth at bedtime.     vitamin E 400 UNIT capsule  Take 400 Units by mouth daily.        Diagnostic Studies: Dg Knee Right Port  06/26/2014   CLINICAL DATA:  Postop right knee replacement  EXAM: PORTABLE RIGHT KNEE - 1-2 VIEW  COMPARISON:  None.  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  Associated subcutaneous gas and surgical drain.  No fracture or dislocation is seen.  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   Electronically Signed   By: Charline Bills M.D.   On: 06/26/2014 15:29    Disposition: 06-Home-Health Care Svc  Discharge Instructions    CPM    Complete by:  As directed   Continuous passive motion machine (CPM):      Use the CPM from 0- to 60 for 6 hours per day.      You may increase by 10 per day.  You may break it up into 2 or 3 sessions per day.      Use CPM for 2-3 weeks or until you are told to stop.     Call MD / Call 911    Complete by:  As directed   If you experience chest pain or shortness of breath, CALL 911 and be transported to the hospital emergency room.  If you develope a fever above 101 F, pus (white drainage) or increased drainage or redness at the wound, or calf pain, call your surgeon's office.     Change dressing    Complete by:  As directed   Change dressing on saturday, then change the dressing daily with sterile 4 x 4 inch gauze dressing and apply TED hose.  You may clean the incision with alcohol prior to redressing.     Constipation Prevention    Complete by:  As directed   Drink plenty of fluids.  Prune juice may be helpful.  You may use a stool softener, such as Colace (over the counter) 100 mg twice a day.  Use MiraLax (over the  counter) for constipation as needed.     Diet - low sodium  heart healthy    Complete by:  As directed      Discharge instructions    Complete by:  As directed   INSTRUCTIONS AFTER JOINT REPLACEMENT   Remove items at home which could result in a fall. This includes throw rugs or furniture in walking pathways ICE to the affected joint every three hours while awake for 30 minutes at a time, for at least the first 3-5 days, and then as needed for pain and swelling.  Continue to use ice for pain and swelling. You may notice swelling that will progress down to the foot and ankle.  This is normal after surgery.  Elevate your leg when you are not up walking on it.   Continue to use the breathing machine you got in the hospital (incentive spirometer) which will help keep your temperature down.  It is common for your temperature to cycle up and down following surgery, especially at night when you are not up moving around and exerting yourself.  The breathing machine keeps your lungs expanded and your temperature down.  BLOOD THINNER:  TAKE ELIQUIS AS DIRECTED FOR A TOTAL OF 12 DAYS FOLLOWING SURGERY TO PREVENT BLOOD CLOTS  DIET:  As you were doing prior to hospitalization, we recommend a well-balanced diet.  DRESSING / WOUND CARE / SHOWERING  You may change your dressing 3-5 days after surgery.  Then change the dressing every day with sterile gauze.  Please use good hand washing techniques before changing the dressing.  Do not use any lotions or creams on the incision until instructed by your surgeon. and You may shower 3 days after surgery, but keep the wounds dry during showering.  You may use an occlusive plastic wrap (Press'n Seal for example), NO SOAKING/SUBMERGING IN THE BATHTUB.  If the bandage gets wet, change with a clean dry gauze.  If the incision gets wet, pat the wound dry with a clean towel.  ACTIVITY  Increase activity slowly as tolerated, but follow the weight bearing instructions below.   No driving for 6 weeks or until further direction given by  your physician.  You cannot drive while taking narcotics.  No lifting or carrying greater than 10 lbs. until further directed by your surgeon. Avoid periods of inactivity such as sitting longer than an hour when not asleep. This helps prevent blood clots.  You may return to work once you are authorized by your doctor.     WEIGHT BEARING   Weight bearing as tolerated with assist device (walker, cane, etc) as directed, use it as long as suggested by your surgeon or therapist, typically at least 4-6 weeks.   EXERCISES  Results after joint replacement surgery are often greatly improved when you follow the exercise, range of motion and muscle strengthening exercises prescribed by your doctor. Safety measures are also important to protect the joint from further injury. Any time any of these exercises cause you to have increased pain or swelling, decrease what you are doing until you are comfortable again and then slowly increase them. If you have problems or questions, call your caregiver or physical therapist for advice.   Rehabilitation is important following a joint replacement. After just a few days of immobilization, the muscles of the leg can become weakened and shrink (atrophy).  These exercises are designed to build up the tone and strength of the thigh and leg muscles and to improve motion. Often  times heat used for twenty to thirty minutes before working out will loosen up your tissues and help with improving the range of motion but do not use heat for the first two weeks following surgery (sometimes heat can increase post-operative swelling).   These exercises can be done on a training (exercise) mat, on the floor, on a table or on a bed. Use whatever works the best and is most comfortable for you.    Use music or television while you are exercising so that the exercises are a pleasant break in your day. This will make your life better with the exercises acting as a break in your routine that  you can look forward to.   Perform all exercises about fifteen times, three times per day or as directed.  You should exercise both the operative leg and the other leg as well.  Exercises include:   Quad Sets - Tighten up the muscle on the front of the thigh (Quad) and hold for 5-10 seconds.   Straight Leg Raises - With your knee straight (if you were given a brace, keep it on), lift the leg to 60 degrees, hold for 3 seconds, and slowly lower the leg.  Perform this exercise against resistance later as your leg gets stronger.  Leg Slides: Lying on your back, slowly slide your foot toward your buttocks, bending your knee up off the floor (only go as far as is comfortable). Then slowly slide your foot back down until your leg is flat on the floor again.  Angel Wings: Lying on your back spread your legs to the side as far apart as you can without causing discomfort.  Hamstring Strength:  Lying on your back, push your heel against the floor with your leg straight by tightening up the muscles of your buttocks.  Repeat, but this time bend your knee to a comfortable angle, and push your heel against the floor.  You may put a pillow under the heel to make it more comfortable if necessary.   A rehabilitation program following joint replacement surgery can speed recovery and prevent re-injury in the future due to weakened muscles. Contact your doctor or a physical therapist for more information on knee rehabilitation.    CONSTIPATION  Constipation is defined medically as fewer than three stools per week and severe constipation as less than one stool per week.  Even if you have a regular bowel pattern at home, your normal regimen is likely to be disrupted due to multiple reasons following surgery.  Combination of anesthesia, postoperative narcotics, change in appetite and fluid intake all can affect your bowels.   YOU MUST use at least one of the following options; they are listed in order of increasing  strength to get the job done.  They are all available over the counter, and you may need to use some, POSSIBLY even all of these options:    Drink plenty of fluids (prune juice may be helpful) and high fiber foods Colace 100 mg by mouth twice a day  Senokot for constipation as directed and as needed Dulcolax (bisacodyl), take with full glass of water  Miralax (polyethylene glycol) once or twice a day as needed.  If you have tried all these things and are unable to have a bowel movement in the first 3-4 days after surgery call either your surgeon or your primary doctor.    If you experience loose stools or diarrhea, hold the medications until you stool forms back up.  If  your symptoms do not get better within 1 week or if they get worse, check with your doctor.  If you experience "the worst abdominal pain ever" or develop nausea or vomiting, please contact the office immediately for further recommendations for treatment.   ITCHING:  If you experience itching with your medications, try taking only a single pain pill, or even half a pain pill at a time.  You can also use Benadryl over the counter for itching or also to help with sleep.   TED HOSE STOCKINGS:  Use stockings on both legs until for at least 2 weeks or as directed by physician office. They may be removed at night for sleeping.  MEDICATIONS:  See your medication summary on the "After Visit Summary" that nursing will review with you.  You may have some home medications which will be placed on hold until you complete the course of blood thinner medication.  It is important for you to complete the blood thinner medication as prescribed.  PRECAUTIONS:  If you experience chest pain or shortness of breath - call 911 immediately for transfer to the hospital emergency department.   If you develop a fever greater that 101 F, purulent drainage from wound, increased redness or drainage from wound, foul odor from the wound/dressing, or calf pain -  CONTACT YOUR SURGEON.                                                   FOLLOW-UP APPOINTMENTS:  If you do not already have a post-op appointment, please call the office for an appointment to be seen by your surgeon.  Guidelines for how soon to be seen are listed in your "After Visit Summary", but are typically between 1-4 weeks after surgery.  OTHER INSTRUCTIONS:   Knee Replacement:  Do not place pillow under knee, focus on keeping the knee straight while resting. CPM instructions: 0-90 degrees, 2 hours in the morning, 2 hours in the afternoon, and 2 hours in the evening. Place foam block, curve side up under heel at all times except when in CPM or when walking.  DO NOT modify, tear, cut, or change the foam block in any way.  MAKE SURE YOU:  Understand these instructions.  Get help right away if you are not doing well or get worse.     Increase activity slowly as tolerated    Complete by:  As directed      TED hose    Complete by:  As directed   Use stockings (TED hose) for 2 weeks on both leg(s).  You may remove them at night for sleeping.           Follow-up Information    Follow up with [facility] F, MD. Schedule an appointment as soon as possible for a visit in 1 week.   Specialty:  Orthopedic Surgery   Contact information:   [address] [place] [address] [phone]       Follow up with [facility].   Why:  Someone from [facility] will congtact you concerning start time for therapy.   Contact information:   [address] [place] [address] [phone]        Signed: Otilio Saber 06/28/2014, 7:35 AM

## 2014-06-26 NOTE — Anesthesia Preprocedure Evaluation (Addendum)
Anesthesia Evaluation  Patient identified by MRN, date of birth, ID band Patient awake    Reviewed: Allergy & Precautions, NPO status , Patient's Chart, lab work & pertinent test results  Airway Mallampati: I  TM Distance: >3 FB Neck ROM: Full    Dental  (+) Teeth Intact, Dental Advisory Given   Pulmonary neg pulmonary ROS,  breath sounds clear to auscultation        Cardiovascular negative cardio ROS  Rhythm:Regular Rate:Normal     Neuro/Psych negative neurological ROS     GI/Hepatic Neg liver ROS, GERD-  ,  Endo/Other  Morbid obesity  Renal/GU      Musculoskeletal   Abdominal   Peds  Hematology   Anesthesia Other Findings   Reproductive/Obstetrics                            Anesthesia Physical Anesthesia Plan  ASA: III  Anesthesia Plan: General   Post-op Pain Management:    Induction: Intravenous  Airway Management Planned: Oral ETT and LMA  Additional Equipment:   Intra-op Plan:   Post-operative Plan: Extubation in OR  Informed Consent: I have reviewed the patients History and Physical, chart, labs and discussed the procedure including the risks, benefits and alternatives for the proposed anesthesia with the patient or authorized representative who has indicated his/her understanding and acceptance.   Dental advisory given  Plan Discussed with: CRNA and Surgeon  Anesthesia Plan Comments: (Options discussed prefers GA)       Anesthesia Quick Evaluation

## 2014-06-26 NOTE — H&P (View-Only) (Signed)
TOTAL KNEE ADMISSION H&P  Patient is being admitted for right total knee arthroplasty.  Subjective:  Chief Complaint:right knee pain.  HPI: [name], 49 y.o. female, has a history of pain and functional disability in the right knee due to arthritis and has failed non-surgical conservative treatments for greater than 12 weeks to includeNSAID's and/or analgesics, corticosteriod injections and activity modification.  Onset of symptoms was gradual, starting 5 years ago with gradually worsening course since that time. The patient noted prior procedures on the knee to include  arthroscopy, menisectomy and ACL reconstruction on the right knee(s).  Patient currently rates pain in the right knee(s) at 8 out of 10 with activity. Patient has night pain, worsening of pain with activity and weight bearing, pain that interferes with activities of daily living, pain with passive range of motion, crepitus and joint swelling.  Patient has evidence of subchondral sclerosis, periarticular osteophytes and joint space narrowing by imaging studies. There is no active infection.  Patient Active Problem List   Diagnosis Date Noted  . DJD (degenerative joint disease) of knee 04/17/2014   Past Medical History  Diagnosis Date  . Complication of anesthesia     Pt hands and feet itch after having anesthesia from too much fluid  . PONV (postoperative nausea and vomiting)   . Pneumonia     hx of  . GERD (gastroesophageal reflux disease)   . DJD (degenerative joint disease) of knee     Past Surgical History  Procedure Laterality Date  . Knee surgery Right   . Tonsillectomy    . Appendectomy    . Colonoscopy    . Breast enhancement surgery    . Abdominal hysterectomy  1991  . Venous ablation Bilateral     Vein ablations  . Total knee arthroplasty Left 04/17/2014    Procedure: TOTAL KNEE ARTHROPLASTY;  Surgeon: Loreta Aveaniel F Murphy, MD;  [facility];  Service: Orthopedics;  Laterality: Left;     (Not in a  hospital admission) No Known Allergies  History  Substance Use Topics  . Smoking status: Never Smoker   . Smokeless tobacco: Never Used  . Alcohol Use: No    No family history on file.   Review of Systems  Constitutional: Negative.   HENT: Negative.   Eyes: Negative.   Respiratory: Negative.   Cardiovascular: Negative.   Gastrointestinal: Negative.   Genitourinary: Negative.   Musculoskeletal: Positive for joint pain.  Skin: Negative.   Neurological: Negative.   Endo/Heme/Allergies: Negative.   Psychiatric/Behavioral: Negative.     Objective:  Physical Exam  Constitutional: She is oriented to person, place, and time. She appears well-developed and well-nourished.  HENT:  Head: Normocephalic and atraumatic.  Eyes: EOM are normal. Pupils are equal, round, and reactive to light.  Neck: Normal range of motion. Neck supple.  Cardiovascular: Normal rate, regular rhythm and normal heart sounds.   Respiratory: Effort normal and breath sounds normal. No respiratory distress. She has no wheezes. She has no rales.  GI: Soft. Bowel sounds are normal.  Musculoskeletal:  Examination of her right knee reveals 1+ effusion.  Medial and lateral joint line tenderness to palpation.  Moderate patellofemoral crepitus.  Marked tenderness and swelling in the pes bursa.  Range of motion 0-100 degrees.  She is neurovascularly intact distally.     Neurological: She is alert and oriented to person, place, and time.  Skin: Skin is warm and dry.  Psychiatric: She has a normal mood and affect. Her behavior is  normal. Judgment and thought content normal.    Vital signs in last 24 hours: @  Labs:   Estimated body mass index is 34.75 kg/(m^2) as calculated from the following:   Height as of 04/19/14:  (1.651 m).   Weight as of 04/04/14: 94.711 kg (208 lb 12.8 oz).   Imaging Review Plain radiographs demonstrate severe degenerative joint disease of the right knee(s). The overall  alignment isneutral. The bone quality appears to be fair for age and reported activity level.  Assessment/Plan:  End stage arthritis, right knee   The patient history, physical examination, clinical judgment of the provider and imaging studies are consistent with end stage degenerative joint disease of the right knee(s) and total knee arthroplasty is deemed medically necessary. The treatment options including medical management, injection therapy arthroscopy and arthroplasty were discussed at length. The risks and benefits of total knee arthroplasty were presented and reviewed. The risks due to aseptic loosening, infection, stiffness, patella tracking problems, thromboembolic complications and other imponderables were discussed. The patient acknowledged the explanation, agreed to proceed with the plan and consent was signed. Patient is being admitted for inpatient treatment for surgery, pain control, PT, OT, prophylactic antibiotics, VTE prophylaxis, progressive ambulation and ADL's and discharge planning. The patient is planning to be discharged home with home health services

## 2014-06-26 NOTE — Interval H&P Note (Signed)
History and Physical Interval Note:  06/26/2014 8:32 AM  [name]  has presented today for surgery, with the diagnosis of DJD RIGHT KNEE  The various methods of treatment have been discussed with the patient and family. After consideration of risks, benefits and other options for treatment, the patient has consented to  Procedure(s): RIGHT TOTAL KNEE ARTHROPLASTY (Right) as a surgical intervention .  The patient's history has been reviewed, patient examined, no change in status, stable for surgery.  I have reviewed the patient's chart and labs.  Questions were answered to the patient's satisfaction.     Vuong Musa F

## 2014-06-26 NOTE — Anesthesia Procedure Notes (Signed)
Procedure Name: Intubation Date/Time: 06/26/2014 11:00 AM Performed by: Jahquan Klugh, Jannet AskewHARLESETTA M Pre-anesthesia Checklist: Patient identified, Timeout performed, Emergency Drugs available, Suction available and Patient being monitored Patient Re-evaluated:Patient Re-evaluated prior to inductionOxygen Delivery Method: Circle system utilized Preoxygenation: Pre-oxygenation with 100% oxygen Intubation Type: IV induction Ventilation: Mask ventilation without difficulty Laryngoscope Size: Mac and 3 Grade View: Grade I Number of attempts: 1 Placement Confirmation: ETT inserted through vocal cords under direct vision Secured at: 21 cm Dental Injury: Teeth and Oropharynx as per pre-operative assessment

## 2014-06-26 NOTE — Progress Notes (Signed)
Orthopedic Tech Progress Note Patient Details:  [name] 02/12/1966 [id number] Delivered CPM to pt.'s nurse.  Unable to place CPM on pt. due to pending x-ray.  Pt.'s nurse stated she would apply CPM after x-ray.  Applied OHF with trapeze to pt.'s bed.  Left Bone Foam with pt.'s nurse.     Lesle ChrisGilliland, Saamir Armstrong L 06/26/2014, 1:36 PM

## 2014-06-27 ENCOUNTER — Encounter (HOSPITAL_COMMUNITY): Payer: Self-pay | Admitting: Orthopedic Surgery

## 2014-06-27 LAB — BASIC METABOLIC PANEL
ANION GAP: 10 (ref 5–15)
BUN: <5 mg/dL — ABNORMAL LOW (ref 6–23)
CALCIUM: 8.2 mg/dL — AB (ref 8.4–10.5)
CO2: 24 mmol/L (ref 19–32)
Chloride: 103 mmol/L (ref 96–112)
Creatinine, Ser: 0.57 mg/dL (ref 0.50–1.10)
GFR calc Af Amer: >90 mL/min (ref >90)
Glucose, Bld: 117 mg/dL — ABNORMAL HIGH (ref 70–99)
POTASSIUM: 4 mmol/L (ref 3.5–5.1)
SODIUM: 137 mmol/L (ref 135–145)

## 2014-06-27 LAB — CBC
HCT: 29.7 % — ABNORMAL LOW (ref 36.0–46.0)
HEMOGLOBIN: 9.7 g/dL — AB (ref 12.0–15.0)
MCH: 27.3 pg (ref 26.0–34.0)
MCHC: 32.7 g/dL (ref 30.0–36.0)
MCV: 83.7 fL (ref 78.0–100.0)
Platelets: 321 10*3/uL (ref 150–400)
RBC: 3.55 MIL/uL — ABNORMAL LOW (ref 3.87–5.11)
RDW: 13.9 % (ref 11.5–15.5)
WBC: 8.2 10*3/uL (ref 4.0–10.5)

## 2014-06-27 MED ORDER — ESTRADIOL 0.05 MG/24HR TD PTTW: 0.0500 mg | MEDICATED_PATCH | TRANSDERMAL | Status: DC

## 2014-06-27 MED ORDER — MEPERIDINE HCL 50 MG PO TABS
50.0000 mg | ORAL_TABLET | ORAL | Status: DC | PRN
Start: 2014-06-27 — End: 2014-06-28
  Administered 2014-06-27 – 2014-06-28 (×6): 50 mg via ORAL
  Filled 2014-06-27 (×6): qty 1

## 2014-06-27 NOTE — Progress Notes (Signed)
VASCULAR LAB PRELIMINARY  PRELIMINARY  PRELIMINARY  PRELIMINARY  Right lower extremity venous Doppler completed.    Preliminary report:  There is no DVT or SVT noted in the right lower extremity.   Wasif Simonich, RVT 06/27/2014, 2:27 PM

## 2014-06-27 NOTE — Evaluation (Signed)
Physical Therapy Evaluation Patient Details Name: [name] MRN: [id number] DOB: [birth year] Today's Date: 06/27/2014   History of Present Illness  Pt is a 49  y/o F s/p R TKA.  Pt's PMH includes L TKA ~9 weeks ago, GERD, and PONV.  Clinical Impression  Pt is s/p R TKA resulting in the deficits listed below (see PT Problem List). Pt is reluctant to attempt ambulation this session as she reports when she had her L knee replaced ~9 weeks ago she experienced syncope x2 with mobility.  Pt limited to stand pivot transfer this session 2/2 fear of syncope and pain in R knee.  Pt will benefit from skilled PT to increase their independence and safety with mobility to allow discharge to the venue listed below.     Follow Up Recommendations Home health PT;Supervision for mobility/OOB    Equipment Recommendations  None recommended by PT    Recommendations for Other Services       Precautions / Restrictions Precautions Precautions: Knee;Fall Precaution Booklet Issued: Yes (comment) Precaution Comments: Reviewed no pillow under knee Required Braces or Orthoses: Knee Immobilizer - Right Knee Immobilizer - Right: Other (comment) (in room, no specific order) Restrictions Weight Bearing Restrictions: Yes RLE Weight Bearing: Weight bearing as tolerated      Mobility  Bed Mobility Overal bed mobility: Needs Assistance Bed Mobility: Supine to Sit     Supine to sit: Min assist     General bed mobility comments: Min A managing RLE to sitting EOB, mod reliance on bed rails, increased time  Transfers Overall transfer level: Needs assistance Equipment used: Rolling walker (2 wheeled) Transfers: Sit to/from [employer] Sit to Stand: Min guard Stand pivot transfers: Min guard       General transfer comment: Cues to push from bed rather than pull on RW, reminder of WB status  Ambulation/Gait             General Gait Details: pivotal steps only  Social research officer, government Rankin (Stroke Patients Only)       Balance                                             Pertinent Vitals/Pain Pain Assessment: 0-10 Pain Score: 8  Pain Location: R knee and L knee Pain Descriptors / Indicators: Aching;Discomfort;Grimacing;Guarding;Heaviness Pain Intervention(s): Limited activity within patient's tolerance;Monitored during session;Repositioned    Home Living Family/patient expects to be discharged to:: Private residence Living Arrangements: Spouse/significant other Available Help at Discharge: Family;Available 24 hours/day (husband and daughters) Type of Home: House Home Access: Level entry     Home Layout: One level Home Equipment: Walker - 2 wheels;Crutches;Bedside commode      Prior Function Level of Independence: Independent with assistive device(s) (crutch in community and RW in home)               Hand Dominance   Dominant Hand: Right    Extremity/Trunk Assessment               Lower Extremity Assessment: RLE deficits/detail;LLE deficits/detail RLE Deficits / Details: weakness and ROM limitations as expected s/p R TKA LLE Deficits / Details: residual weakness and limited ROM, L TKA ~9 weeks ago     Communication   Communication: No difficulties  Cognition Arousal/Alertness:  Awake/alert Behavior During Therapy: WFL for tasks assessed/performed Overall Cognitive Status: Within Functional Limits for tasks assessed                      General Comments      Exercises Total Joint Exercises Ankle Circles/Pumps: AROM;Both;10 reps;Supine Quad Sets: AROM;Both;5 reps;Supine Heel Slides: AAROM;Right;5 reps;Supine Knee Flexion: AROM;Right;5 reps;Seated Goniometric ROM: 8-116      Assessment/Plan    PT Assessment Patient needs continued PT services  PT Diagnosis Difficulty walking;Abnormality of gait;Generalized weakness;Acute pain   PT Problem List Decreased  strength;Decreased range of motion;Decreased activity tolerance;Decreased balance;Decreased mobility;Decreased coordination;Decreased knowledge of use of DME;Decreased safety awareness;Decreased knowledge of precautions;Decreased skin integrity;Pain  PT Treatment Interventions DME instruction;Gait training;Functional mobility training;Therapeutic activities;Therapeutic exercise;Balance training;Neuromuscular re-education;Patient/family education;Modalities   PT Goals (Current goals can be found in the Care Plan section) Acute Rehab PT Goals Patient Stated Goal: to go home tomorrow and to take therapy slowly PT Goal Formulation: With patient/family Time For Goal Achievement: 07/04/14 Potential to Achieve Goals: Good    Frequency 7X/week   Barriers to discharge        Co-evaluation               End of Session Equipment Utilized During Treatment: Gait belt;Right knee immobilizer Activity Tolerance: Patient limited by pain Patient left: in chair;with call bell/phone within reach;with family/visitor present Nurse Communication: Mobility status;Precautions;Weight bearing status         Time: [id number] PT Time Calculation (min) (ACUTE ONLY): 37 min   Charges:   PT Evaluation $Initial PT Evaluation Tier I: 1 Procedure PT Treatments $Therapeutic Exercise: 8-22 mins   PT G Codes:       Michail JewelsAshley Parr PT, DPT [phone] Pager: [phone] 06/27/2014, 9:48 AM

## 2014-06-27 NOTE — Anesthesia Postprocedure Evaluation (Signed)
  Anesthesia Post-op Note  Patient: Ariel LowerKathy L Benish  Procedure(s) Performed: Procedure(s): RIGHT TOTAL KNEE ARTHROPLASTY (Right)  Patient Location: PACU  Anesthesia Type:General  Level of Consciousness: awake and alert   Airway and Oxygen Therapy: Patient Spontanous Breathing  Post-op Pain: mild  Post-op Assessment: Post-op Vital signs reviewed  Post-op Vital Signs: stable  Last Vitals:  Filed Vitals:   06/27/14 0554  BP: 109/55  Pulse: 76  Temp: 37 C  Resp:     Complications: No apparent anesthesia complications

## 2014-06-27 NOTE — Care Management Note (Signed)
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 Patient underwent a right total knee arthroplasty.     Action/Plan:   Patient was preoperatively setup with [facility], no changes. patient has family support at discharge.   Anticipated DC Date:  06/27/2014   Anticipated DC Plan:  HOME W HOME HEALTH SERVICES      DC Planning Services  CM consult      PAC Choice  HOME HEALTH  DURABLE MEDICAL EQUIPMENT   Choice offered to / List presented to:  C-1 Patient   DME arranged  [facility]  3-N-1  CPM      DME agency  [employer]     HH arranged  HH-2 PT      HH agency  [facility]   Status of service:   Medicare Important Message given?   (If response is "NO", the following Medicare IM given date fields will be blank) Date Medicare IM given:   Medicare IM given by:   Date Additional Medicare IM given:   Additional Medicare IM given by:    Discharge Disposition:  HOME W HOME HEALTH SERVICES  Per UR Regulation:  Reviewed for med. necessity/level of care/duration of stay  

## 2014-06-27 NOTE — Evaluation (Signed)
Occupational Therapy Evaluation Patient Details Name: [name] MRN: [id number] DOB: 04/02/1965 Today's Date: 06/27/2014    History of Present Illness Pt is a 49  y/o F s/p R TKA.  Pt's PMH includes L TKA ~9 weeks ago, GERD, and PONV.   Clinical Impression   Patient mod I with occasional falls due to weak RLE PTA. Patient was receiving PT for LLE (L TKA ~9 weeks ago) Patient currently requires assistance with LB ADLs and supervision for sit<>stands and functional transfers. Encouraged patient to wear right KI for all mobility at this time due to RLE weakness.  Patient will benefit from acute OT to increase overall independence in the areas of ADLs, functional mobility, and overall safety in order to safely discharge home with assistance prn.     Follow Up Recommendations  No OT follow up;Supervision - Intermittent    Equipment Recommendations  None recommended by OT    Recommendations for Other Services  None at this time   Precautions / Restrictions Precautions Precautions: Knee;Fall Precaution Booklet Issued: Yes (comment) Precaution Comments: Reviewed no pillow under knee Required Braces or Orthoses: Knee Immobilizer - Right Knee Immobilizer - Right: Other (comment) (in room, no specific order for KI) Restrictions Weight Bearing Restrictions: Yes RLE Weight Bearing: Weight bearing as tolerated      Mobility Bed Mobility Overal bed mobility: Needs Assistance Bed Mobility: Supine to Sit     Supine to sit: Min assist     General bed mobility comments: Min A managing RLE to sitting EOB, mod reliance on bed rails, increased time  Transfers Overall transfer level: Needs assistance Equipment used: Rolling walker (2 wheeled) Transfers: Sit to/from Stand Sit to Stand: Supervision Stand pivot transfers: Min guard       General transfer comment: Supervision for safety only, no cues needed for technique     Balance Overall balance assessment: Needs  assistance Sitting-balance support: No upper extremity supported;Feet supported Sitting balance-Leahy Scale: Good     Standing balance support: Bilateral upper extremity supported;During functional activity Standing balance-Leahy Scale: Fair    ADL Overall ADL's : Needs assistance/impaired General ADL Comments: Patient limited by complaints of possible syncope during mobility, therefore patient refused any functional ambulation/mobility this OT eval/treat Patient unable to reach BLEs for LB ADLs, encouraged patient to work on reaching feet to increase independence with LB ADLs. Patient able to perform sit<>stand with close sueprvision for safety, no cues needed for technique during this session. Patient's daughter present and states she will be available to assist patient prn post acute d/c. Will folllow patient acutely for OT.     Pertinent Vitals/Pain Pain Assessment: 0-10 Pain Score: 7  Pain Location: RLE Pain Descriptors / Indicators: Aching;Sore Pain Intervention(s): Limited activity within patient's tolerance;Monitored during session;Ice applied     Hand Dominance Right   Extremity/Trunk Assessment Upper Extremity Assessment Upper Extremity Assessment: Overall WFL for tasks assessed   Lower Extremity Assessment Lower Extremity Assessment: Defer to PT evaluation RLE Deficits / Details: weakness and ROM limitations as expected s/p R TKA RLE Sensation:  (intact) LLE Deficits / Details: residual weakness and limited ROM, L TKA ~9 weeks ago LLE Sensation:  (intact)   Cervical / Trunk Assessment Cervical / Trunk Assessment: Normal   Communication Communication Communication: No difficulties   Cognition Arousal/Alertness: Awake/alert Behavior During Therapy: WFL for tasks assessed/performed Overall Cognitive Status: Within Functional Limits for tasks assessed              Home Living Family/patient  expects to be discharged to:: Private residence Living Arrangements:  Spouse/significant other Available Help at Discharge: Family;Available 24 hours/day (husband and daughters) Type of Home: House Home Access: Level entry     Home Layout: One level     Bathroom Shower/Tub: Tub/shower unit;Curtain         Home Equipment: Environmental consultant - 2 wheels;Crutches;Bedside commode;Tub bench   Prior Functioning/Environment Level of Independence: Independent with assistive device(s)     OT Diagnosis: Generalized weakness;Acute pain   OT Problem List: Decreased strength;Decreased range of motion;Decreased activity tolerance;Impaired balance (sitting and/or standing);Decreased safety awareness;Decreased knowledge of use of DME or AE;Decreased knowledge of precautions;Pain;Obesity   OT Treatment/Interventions: Self-care/ADL training;Therapeutic exercise;DME and/or AE instruction;Therapeutic activities;Energy conservation;Patient/family education;Balance training    OT Goals(Current goals can be found in the care plan section) Acute Rehab OT Goals Patient Stated Goal: go home soon OT Goal Formulation: With patient/family Time For Goal Achievement: 07/04/14 Potential to Achieve Goals: Good ADL Goals Pt Will Perform Grooming: with modified independence;standing Pt Will Perform Upper Body Bathing: Independently;sitting Pt Will Perform Lower Body Bathing: with modified independence;sit to/from stand Pt Will Perform Upper Body Dressing: Independently;sitting Pt Will Perform Lower Body Dressing: with modified independence;sit to/from stand Pt Will Transfer to Toilet: with modified independence;ambulating;bedside commode Pt Will Perform Toileting - Clothing Manipulation and hygiene: with modified independence;sit to/from stand Pt Will Perform Tub/Shower Transfer: Tub transfer;tub bench;ambulating;rolling walker;with modified independence  OT Frequency: Min 2X/week   Barriers to D/C: None known at this time   End of Session Equipment Utilized During Treatment: Rolling  walker CPM Right Knee CPM Right Knee: Off  Activity Tolerance: Patient tolerated treatment well Patient left: in chair;with call bell/phone within reach;with family/visitor present   Time: 1191-4782 OT Time Calculation (min): 23 min Charges:  OT General Charges $OT Visit: 1 Procedure OT Evaluation $Initial OT Evaluation Tier I: 1 Procedure OT Treatments $Self Care/Home Management : 8-22 mins  Tsion Inghram , MS, OTR/L, CLT Pager: [phone]  06/27/2014, 11:32 AM

## 2014-06-27 NOTE — Progress Notes (Signed)
Physical Therapy Treatment Patient Details Name: [name] MRN: [id number] DOB: February 17, 1966 Today's Date: 06/27/2014    History of Present Illness Pt is a 49  y/o F s/p R TKA.  Pt's PMH includes L TKA ~9 weeks ago, GERD, and PONV.    PT Comments    Pt is making slow progress w/ PT limited by her dizziness, nausea, and pain; however, pt continues to make gains in ambulatory distance this session, ambulating 10 ft in room.  Pt will benefit from continued skilled PT services to increase functional independence and safety.   Follow Up Recommendations  Home health PT;Supervision for mobility/OOB     Equipment Recommendations  None recommended by PT    Recommendations for Other Services       Precautions / Restrictions Precautions Precautions: Knee;Fall Precaution Comments: Reviewed no pillow under knee Required Braces or Orthoses: Knee Immobilizer - Right Knee Immobilizer - Right: Other (comment) (in room, no specific order for KI) Restrictions Weight Bearing Restrictions: Yes RLE Weight Bearing: Weight bearing as tolerated    Mobility  Bed Mobility Overal bed mobility: Needs Assistance Bed Mobility: Supine to Sit     Supine to sit: Min assist     General bed mobility comments: Min A managing RLE to sitting EOB, mod reliance on bed rails, increased time  Transfers Overall transfer level: Needs assistance Equipment used: Rolling walker (2 wheeled) Transfers: Sit to/from Stand Sit to Stand: Supervision         General transfer comment: Supervision for safety only, no cues needed for technique   Ambulation/Gait Ambulation/Gait assistance: Min guard Ambulation Distance (Feet): 10 Feet Assistive device: Rolling walker (2 wheeled) Gait Pattern/deviations: Step-to pattern;Decreased stride length;Decreased stance time - right;Antalgic;Trunk flexed   Gait velocity interpretation: Below normal speed for age/gender General Gait Details: close chair follow.  Pt  becomes fatigued and nauseous after ambulating 10 ft and requests to sit.   Stairs            Wheelchair Mobility    Modified Rankin (Stroke Patients Only)       Balance Overall balance assessment: Needs assistance Sitting-balance support: Bilateral upper extremity supported;Feet supported Sitting balance-Leahy Scale: Fair Sitting balance - Comments: reports min dizziness which dissipates prior to sit>stand   Standing balance support: Bilateral upper extremity supported;During functional activity Standing balance-Leahy Scale: Fair                      Cognition Arousal/Alertness: Awake/alert Behavior During Therapy: WFL for tasks assessed/performed Overall Cognitive Status: Within Functional Limits for tasks assessed                      Exercises Total Joint Exercises Ankle Circles/Pumps: AROM;Both;10 reps;Supine Quad Sets: AROM;Both;5 reps;Supine    General Comments General comments (skin integrity, edema, etc.): Homan's sign negative BLE. Neither R nor LLE are sensitive to calf squeeze.      Pertinent Vitals/Pain Pain Assessment: 0-10 Pain Score: 6  Pain Location: R knee Pain Descriptors / Indicators: Aching;Jabbing Pain Intervention(s): Limited activity within patient's tolerance;Monitored during session;Repositioned    Home Living                      Prior Function            PT Goals (current goals can now be found in the care plan section) Acute Rehab PT Goals Patient Stated Goal: go home tomorrow PT Goal Formulation: With patient/family Time For  Goal Achievement: 07/04/14 Potential to Achieve Goals: Good Progress towards PT goals: Progressing toward goals    Frequency  7X/week    PT Plan Current plan remains appropriate    Co-evaluation             End of Session Equipment Utilized During Treatment: Gait belt;Right knee immobilizer Activity Tolerance: Patient limited by fatigue (limited by nausea) Patient  left: in chair;with call bell/phone within reach;with family/visitor present     Time: [id number] PT Time Calculation (min) (ACUTE ONLY): 22 min  Charges:  $Gait Training: 8-22 mins                    G Codes:      Michail JewelsAshley Parr PT, [place]PT [phone] Pager: [phone] 06/27/2014, 5:12 PM

## 2014-06-27 NOTE — Progress Notes (Signed)
Utilization review completed.  

## 2014-06-27 NOTE — Discharge Instructions (Signed)
INSTRUCTIONS AFTER JOINT REPLACEMENT  ° °o Remove items at home which could result in a fall. This includes throw rugs or furniture in walking pathways °o ICE to the affected joint every three hours while awake for 30 minutes at a time, for at least the first 3-5 days, and then as needed for pain and swelling.  Continue to use ice for pain and swelling. You may notice swelling that will progress down to the foot and ankle.  This is normal after surgery.  Elevate your leg when you are not up walking on it.   °o Continue to use the breathing machine you got in the hospital (incentive spirometer) which will help keep your temperature down.  It is common for your temperature to cycle up and down following surgery, especially at night when you are not up moving around and exerting yourself.  The breathing machine keeps your lungs expanded and your temperature down. ° °BLOOD THINNER:  TAKE ELIQUIS AS DIRECTED FOR A TOTAL OF 12 DAYS FOLLOWING SURGERY TO PREVENT BLOOD CLOTS ° °DIET:  As you were doing prior to hospitalization, we recommend a well-balanced diet. ° °DRESSING / WOUND CARE / SHOWERING ° °You may change your dressing 3-5 days after surgery.  Then change the dressing every day with sterile gauze.  Please use good hand washing techniques before changing the dressing.  Do not use any lotions or creams on the incision until instructed by your surgeon. and You may shower 3 days after surgery, but keep the wounds dry during showering.  You may use an occlusive plastic wrap (Press'n Seal for example), NO SOAKING/SUBMERGING IN THE BATHTUB.  If the bandage gets wet, change with a clean dry gauze.  If the incision gets wet, pat the wound dry with a clean towel. ° °ACTIVITY ° °o Increase activity slowly as tolerated, but follow the weight bearing instructions below.   °o No driving for 6 weeks or until further direction given by your physician.  You cannot drive while taking narcotics.  °o No lifting or carrying greater  than 10 lbs. until further directed by your surgeon. °o Avoid periods of inactivity such as sitting longer than an hour when not asleep. This helps prevent blood clots.  °o You may return to work once you are authorized by your doctor.  ° ° ° °WEIGHT BEARING  ° °Weight bearing as tolerated with assist device (walker, cane, etc) as directed, use it as long as suggested by your surgeon or therapist, typically at least 4-6 weeks. ° ° °EXERCISES ° °Results after joint replacement surgery are often greatly improved when you follow the exercise, range of motion and muscle strengthening exercises prescribed by your doctor. Safety measures are also important to protect the joint from further injury. Any time any of these exercises cause you to have increased pain or swelling, decrease what you are doing until you are comfortable again and then slowly increase them. If you have problems or questions, call your caregiver or physical therapist for advice.  ° °Rehabilitation is important following a joint replacement. After just a few days of immobilization, the muscles of the leg can become weakened and shrink (atrophy).  These exercises are designed to build up the tone and strength of the thigh and leg muscles and to improve motion. Often times heat used for twenty to thirty minutes before working out will loosen up your tissues and help with improving the range of motion but do not use heat for the first two   weeks following surgery (sometimes heat can increase post-operative swelling).  ° °These exercises can be done on a training (exercise) mat, on the floor, on a table or on a bed. Use whatever works the best and is most comfortable for you.    Use music or television while you are exercising so that the exercises are a pleasant break in your day. This will make your life better with the exercises acting as a break in your routine that you can look forward to.   Perform all exercises about fifteen times, three times per  day or as directed.  You should exercise both the operative leg and the other leg as well. ° °Exercises include: °  °• Quad Sets - Tighten up the muscle on the front of the thigh (Quad) and hold for 5-10 seconds.   °• Straight Leg Raises - With your knee straight (if you were given a brace, keep it on), lift the leg to 60 degrees, hold for 3 seconds, and slowly lower the leg.  Perform this exercise against resistance later as your leg gets stronger.  °• Leg Slides: Lying on your back, slowly slide your foot toward your buttocks, bending your knee up off the floor (only go as far as is comfortable). Then slowly slide your foot back down until your leg is flat on the floor again.  °• Angel Wings: Lying on your back spread your legs to the side as far apart as you can without causing discomfort.  °• Hamstring Strength:  Lying on your back, push your heel against the floor with your leg straight by tightening up the muscles of your buttocks.  Repeat, but this time bend your knee to a comfortable angle, and push your heel against the floor.  You may put a pillow under the heel to make it more comfortable if necessary.  ° °A rehabilitation program following joint replacement surgery can speed recovery and prevent re-injury in the future due to weakened muscles. Contact your doctor or a physical therapist for more information on knee rehabilitation.  ° ° °CONSTIPATION ° °Constipation is defined medically as fewer than three stools per week and severe constipation as less than one stool per week.  Even if you have a regular bowel pattern at home, your normal regimen is likely to be disrupted due to multiple reasons following surgery.  Combination of anesthesia, postoperative narcotics, change in appetite and fluid intake all can affect your bowels.  ° °YOU MUST use at least one of the following options; they are listed in order of increasing strength to get the job done.  They are all available over the counter, and you may  need to use some, POSSIBLY even all of these options:   ° °Drink plenty of fluids (prune juice may be helpful) and high fiber foods °Colace 100 mg by mouth twice a day  °Senokot for constipation as directed and as needed Dulcolax (bisacodyl), take with full glass of water  °Miralax (polyethylene glycol) once or twice a day as needed. ° °If you have tried all these things and are unable to have a bowel movement in the first 3-4 days after surgery call either your surgeon or your primary doctor.   ° °If you experience loose stools or diarrhea, hold the medications until you stool forms back up.  If your symptoms do not get better within 1 week or if they get worse, check with your doctor.  If you experience "the worst abdominal pain ever" or   develop nausea or vomiting, please contact the office immediately for further recommendations for treatment. ° ° °ITCHING:  If you experience itching with your medications, try taking only a single pain pill, or even half a pain pill at a time.  You can also use Benadryl over the counter for itching or also to help with sleep.  ° °TED HOSE STOCKINGS:  Use stockings on both legs until for at least 2 weeks or as directed by physician office. They may be removed at night for sleeping. ° °MEDICATIONS:  See your medication summary on the “After Visit Summary” that nursing will review with you.  You may have some home medications which will be placed on hold until you complete the course of blood thinner medication.  It is important for you to complete the blood thinner medication as prescribed. ° °PRECAUTIONS:  If you experience chest pain or shortness of breath - call 911 immediately for transfer to the hospital emergency department.  ° °If you develop a fever greater that 101 F, purulent drainage from wound, increased redness or drainage from wound, foul odor from the wound/dressing, or calf pain - CONTACT YOUR SURGEON.   °                                                °FOLLOW-UP  APPOINTMENTS:  If you do not already have a post-op appointment, please call the office for an appointment to be seen by your surgeon.  Guidelines for how soon to be seen are listed in your “After Visit Summary”, but are typically between 1-4 weeks after surgery. ° °OTHER INSTRUCTIONS:  ° °Knee Replacement:  Do not place pillow under knee, focus on keeping the knee straight while resting. CPM instructions: 0-90 degrees, 2 hours in the morning, 2 hours in the afternoon, and 2 hours in the evening. Place foam block, curve side up under heel at all times except when in CPM or when walking.  DO NOT modify, tear, cut, or change the foam block in any way. ° °MAKE SURE YOU:  °• Understand these instructions.  °• Get help right away if you are not doing well or get worse.  ° ° °Thank you for letting us be a part of your medical care team.  It is a privilege we respect greatly.  We hope these instructions will help you stay on track for a fast and full recovery!  ° °---------------------------------------------------------------------------------------------------------------------------- °Information on my medicine - ELIQUIS® (apixaban) ° °This medication education was reviewed with me or my healthcare representative as part of my discharge preparation.  The pharmacist that spoke with me during my hospital stay was:  Jarika Robben David, RPH ° °Why was Eliquis® prescribed for you? °Eliquis® was prescribed for you to reduce the risk of blood clots forming after orthopedic surgery.   ° °What do You need to know about Eliquis®? °Take your Eliquis® TWICE DAILY - one tablet in the morning and one tablet in the evening with or without food.  It would be best to take the dose about the same time each day. ° °If you have difficulty swallowing the tablet whole please discuss with your pharmacist how to take the medication safely. ° °Take Eliquis® exactly as prescribed by your doctor and DO NOT stop taking Eliquis® without talking to  the doctor who prescribed the medication.  Stopping without   other medication to take the place of Eliquis® may increase your risk of developing a clot. ° °After discharge, you should have regular check-up appointments with your healthcare provider that is prescribing your Eliquis®. ° °What do you do if you miss a dose? °If a dose of ELIQUIS® is not taken at the scheduled time, take it as soon as possible on the same day and twice-daily administration should be resumed.  The dose should not be doubled to make up for a missed dose.  Do not take more than one tablet of ELIQUIS at the same time. ° °Important Safety Information °A possible side effect of Eliquis® is bleeding. You should call your healthcare provider right away if you experience any of the following: °? Bleeding from an injury or your nose that does not stop. °? Unusual colored urine (red or dark brown) or unusual colored stools (red or black). °? Unusual bruising for unknown reasons. °? A serious fall or if you hit your head (even if there is no bleeding). ° °Some medicines may interact with Eliquis® and might increase your risk of bleeding or clotting while on Eliquis®. To help avoid this, consult your healthcare provider or pharmacist prior to using any new prescription or non-prescription medications, including herbals, vitamins, non-steroidal anti-inflammatory drugs (NSAIDs) and supplements. ° °This website has more information on Eliquis® (apixaban): http://www.eliquis.com/eliquis/home ° °

## 2014-06-27 NOTE — Progress Notes (Signed)
Subjective: 1 Day Post-Op Procedure(s) (LRB): RIGHT TOTAL KNEE ARTHROPLASTY (Right) Patient reports pain as moderate.  Patient reports nausea but no vomiting.  Minimal lightheadedness.  No chest pain/sob.  Decreased appetite.    Objective: Vital signs in last 24 hours: Temp:  [97.7 F (36.5 C)-98.6 F (37 C)] 98.6 F (37 C) (04/28 0554) Pulse Rate:  [76-104] 76 (04/28 0554) Resp:  [7-22] 18 (04/28 0400) BP: (109-161)/(43-71) 109/55 mmHg (04/28 0554) SpO2:  [96 %-100 %] 100 % (04/28 0554) FiO2 (%):  [2 %] 2 % (04/28 0554) Weight:  [98.431 kg (217 lb)] 98.431 kg (217 lb) (04/27 0905)  Intake/Output from previous day: 04/27 0701 - 04/28 0700 In: 800 [I.V.:800] Out: 1250 [Urine:1100; Drains:150] Intake/Output this shift:    No results for input(s): HGB in the last 72 hours. No results for input(s): WBC, RBC, HCT, PLT in the last 72 hours. No results for input(s): NA, K, CL, CO2, BUN, CREATININE, GLUCOSE, CALCIUM in the last 72 hours. No results for input(s): LABPT, INR in the last 72 hours.  Neurologically intact Neurovascular intact Sensation intact distally Intact pulses distally Dorsiflexion/Plantar flexion intact Compartment soft  hemovac drain pulled by me today Exquisite calf tenderness RLE  Assessment/Plan: 1 Day Post-Op Procedure(s) (LRB): RIGHT TOTAL KNEE ARTHROPLASTY (Right) Advance diet Up with therapy D/C IV fluids  Dry dressing change prn WBAT RLE Will order venous doppler RLE D/C oral dilaudid and replace with demerol   Otilio SaberM Lindsey Milagros Middendorf 06/27/2014, 7:08 AM

## 2014-06-28 LAB — CBC
HCT: 28.7 % — ABNORMAL LOW (ref 36.0–46.0)
HEMOGLOBIN: 9.2 g/dL — AB (ref 12.0–15.0)
MCH: 26.8 pg (ref 26.0–34.0)
MCHC: 32.1 g/dL (ref 30.0–36.0)
MCV: 83.7 fL (ref 78.0–100.0)
Platelets: 317 10*3/uL (ref 150–400)
RBC: 3.43 MIL/uL — AB (ref 3.87–5.11)
RDW: 14.1 % (ref 11.5–15.5)
WBC: 6.5 10*3/uL (ref 4.0–10.5)

## 2014-06-28 LAB — BASIC METABOLIC PANEL
ANION GAP: 7 (ref 5–15)
BUN: <5 mg/dL — ABNORMAL LOW (ref 6–23)
CALCIUM: 8 mg/dL — AB (ref 8.4–10.5)
CO2: 28 mmol/L (ref 19–32)
CREATININE: 0.59 mg/dL (ref 0.50–1.10)
Chloride: 101 mmol/L (ref 96–112)
GFR calc Af Amer: >90 mL/min (ref >90)
GFR calc non Af Amer: >90 mL/min (ref >90)
GLUCOSE: 117 mg/dL — AB (ref 70–99)
Potassium: 3.8 mmol/L (ref 3.5–5.1)
Sodium: 136 mmol/L (ref 135–145)

## 2014-06-28 NOTE — Progress Notes (Signed)
Physical Therapy Treatment Patient Details Name: [name] MRN: [id number] DOB: 08-11-1965 Today's Date: 06/28/2014    History of Present Illness Pt is a 49  y/o F s/p R TKA.  Pt's PMH includes L TKA ~9 weeks ago, GERD, and PONV.    PT Comments    Pt is making slow progress w/ ambulatory distance, limited by R knee pain this session, requiring 3 sitting rest breaks.  PT is progressing w/ therapeutic exercises.  Increase in ambulatory distance will be attempted at next session as appropriate.    Follow Up Recommendations  Home health PT;Supervision for mobility/OOB     Equipment Recommendations  None recommended by PT    Recommendations for Other Services       Precautions / Restrictions Precautions Precautions: Knee;Fall Precaution Comments: Reviewed no pillow under knee Required Braces or Orthoses: Knee Immobilizer - Right Knee Immobilizer - Right: Other (comment) (in room, no specific order for KI) Restrictions Weight Bearing Restrictions: Yes RLE Weight Bearing: Weight bearing as tolerated    Mobility  Bed Mobility Overal bed mobility: Needs Assistance Bed Mobility: Supine to Sit     Supine to sit: Min assist     General bed mobility comments: in recliner  Transfers Overall transfer level: Needs assistance Equipment used: Rolling walker (2 wheeled) Transfers: Sit to/from Stand Sit to Stand: Supervision Stand pivot transfers: Min guard       General transfer comment: Supervision for safety, no cues needed  Ambulation/Gait Ambulation/Gait assistance: Min guard Ambulation Distance (Feet): 25 Feet Assistive device: Rolling walker (2 wheeled) Gait Pattern/deviations: Step-to pattern;Decreased stride length;Decreased stance time - right;Antalgic;Trunk flexed   Gait velocity interpretation: Below normal speed for age/gender General Gait Details: close chair follow.  Pt's pain limiting ambulatory distance this session and requires 3 sitting rest breaks  2/2 pain in R knee.  Verbal cues to stand upright which pt has difficulty maintaining.   Stairs            Wheelchair Mobility    Modified Rankin (Stroke Patients Only)       Balance Overall balance assessment: Needs assistance Sitting-balance support: No upper extremity supported;Feet supported Sitting balance-Leahy Scale: Good     Standing balance support: Bilateral upper extremity supported;During functional activity Standing balance-Leahy Scale: Fair                      Cognition Arousal/Alertness: Awake/alert Behavior During Therapy: WFL for tasks assessed/performed Overall Cognitive Status: Within Functional Limits for tasks assessed                      Exercises Total Joint Exercises Quad Sets: AROM;Both;10 reps;Seated Short Arc Quad: AROM;Right;10 reps;Seated Knee Flexion: AROM;Right;5 reps;Seated Goniometric ROM: 0-109    General Comments        Pertinent Vitals/Pain Pain Assessment: 0-10 Pain Score: 8  Pain Location: R knee Pain Descriptors / Indicators: Grimacing;Guarding;Heaviness;Jabbing;Crushing Pain Intervention(s): Limited activity within patient's tolerance;Monitored during session;Repositioned    Home Living                      Prior Function            PT Goals (current goals can now be found in the care plan section) Acute Rehab PT Goals Patient Stated Goal: go home today PT Goal Formulation: With patient/family Time For Goal Achievement: 07/04/14 Potential to Achieve Goals: Good Progress towards PT goals: Progressing toward goals    Frequency  7X/week    PT Plan Current plan remains appropriate    Co-evaluation             End of Session Equipment Utilized During Treatment: Gait belt;Right knee immobilizer Activity Tolerance: Patient limited by pain Patient left: in chair;with call bell/phone within reach;with family/visitor present     Time: 0911-0930 PT Time Calculation (min)  (ACUTE ONLY): 19 min  Charges:  $Gait Training: 8-22 mins                    G Codes:      Michail JewelsAshley Parr PT, TennesseeDPT [phone] Pager: [phone] 06/28/2014, 9:36 AM

## 2014-06-28 NOTE — Progress Notes (Signed)
Subjective: 2 Days Post-Op Procedure(s) (LRB): RIGHT TOTAL KNEE ARTHROPLASTY (Right) Patient reports pain as moderate.  Nausea is much better today.  Patient still reports mild lightheadedness.  No chest pain/sob.  Decreased appetite.  Objective: Vital signs in last 24 hours: Temp:  [98.1 F (36.7 C)-98.4 F (36.9 C)] 98.4 F (36.9 C) (04/29 0658) Pulse Rate:  [85-92] 90 (04/29 0658) Resp:  [16-18] 16 (04/29 0658) BP: (121-122)/(47-58) 122/58 mmHg (04/29 0658) SpO2:  [94 %-100 %] 94 % (04/29 0658)  Intake/Output from previous day: 04/28 0701 - 04/29 0700 In: 600 [P.O.:600] Out: -  Intake/Output this shift:     Recent Labs  06/27/14 0645 06/28/14 0438  HGB 9.7* 9.2*    Recent Labs  06/27/14 0645 06/28/14 0438  WBC 8.2 6.5  RBC 3.55* 3.43*  HCT 29.7* 28.7*  PLT 321 317    Recent Labs  06/27/14 0645 06/28/14 0438  NA 137 136  K 4.0 3.8  CL 103 101  CO2 24 28  BUN <5* <5*  CREATININE 0.57 0.59  GLUCOSE 117* 117*  CALCIUM 8.2* 8.0*   No results for input(s): LABPT, INR in the last 72 hours.  Neurologically intact Neurovascular intact Sensation intact distally Intact pulses distally Dorsiflexion/Plantar flexion intact Incision: scant drainage No cellulitis present Compartment soft  Negative homans bilaterally Dressing changed by me today  Assessment/Plan: 2 Days Post-Op Procedure(s) (LRB): RIGHT TOTAL KNEE ARTHROPLASTY (Right) Advance diet Up with therapy Discharge home with home health today as long as she does ok with PT and pain is under control WBAT RLE ABLA-mild and stable Dry dressing change prn Venous doppler RLE from yesterday was negative  Otilio SaberM Lindsey Stanbery 06/28/2014, 8:04 AM

## 2014-06-28 NOTE — Progress Notes (Signed)
Macky LowerKathy L Honea discharged home per MD order. Discharge instructions reviewed and discussed with patient. All questions and concerns answered. Copy of instructions and scripts given to patient. IV removed.  Patient escorted to car by staff in a wheelchair. No distress noted upon discharge.   Dennard NipScott, Evelio Rueda R 06/28/2014 2:03 PM

## 2014-06-28 NOTE — Op Note (Signed)
[name]:  Buttery, [name]               ACCOUNT NO.:  [id number]  MEDICAL RECORD NO.:  [id number]  LOCATION:  [id number]                        FACILITY:  [facility]  PHYSICIAN:  Loreta Aveaniel F. Clotile Whittington, M.D. DATE OF BIRTH:  1965-11-04  DATE OF PROCEDURE:  06/26/2014 DATE OF DISCHARGE:                              OPERATIVE REPORT   PREOPERATIVE DIAGNOSIS:  Primary generalized end-stage arthritis, right knee.  POSTOPERATIVE DIAGNOSIS:  Primary generalized end-stage arthritis, right knee.  PROCEDURE:  Right knee modified minimally invasive total knee replacement with Stryker Triathlon prosthesis.  Soft tissue balancing. Cemented pegged cruciate retaining #4 femoral component.  Cemented #4 tibial component, 9 mm CS insert.  Cemented resurfacing 32-mm patellar component.  SURGEON:  Loreta Aveaniel F. Auron Tadros, M.D.  ASSISTANT:  Mikey KirschnerLindsey Stanberry, PA, present throughout the entire case and necessary for timely completion of procedure.  ANESTHESIA:  General.  BLOOD LOSS:  Minimal.  SPECIMENS:  None.  CULTURES:  None.  COMPLICATIONS:  None.  DRESSINGS:  Soft compressive knee immobilizer.  DRAIN:  Hemovac x1.  TOURNIQUET TIME:  One hour.  DESCRIPTION OF PROCEDURE:  The patient was brought to the operating room, placed on the operating table in supine position.  After adequate anesthesia had been obtained, tourniquet applied, prepped and draped in usual sterile fashion.  Exsanguinated with elevation of Esmarch, tourniquet inflated to 350 mmHg.  Numerous previous incisions in her knee.  I incorporated one of these with a longitudinal incision above the patella down to tibial tubercle.  Skin and subcutaneous tissue divided.  Medial arthrotomy, vastus splitting.  The entire anterior half of her MCL was markedly degenerative and thin from previous intervention.  Posterior half was in relatively good condition.  Distal femur exposed.  An 8-mm resection 5 degrees of valgus using flexible intramedullary  guide.  Using epicondylar axis, the femur was sized, cut, and fitted for a pegged cruciate retaining #4 component.  Extramedullary guide tibia.  Size 4 component after resection.  Patella exposed posterior 10 mm, removed.  Drilled, sized, and fitted for a 32-mm component.  Trials put in place.  9-mm insert.  Very pleased with flexion, extension, balancing alignment, stability, and patellar tracking.  Tibia was marked for rotation and reamed.  All trials removed.  Copious irrigation with pulse irrigating device.  Cement prepared, placed on all components, firmly seated.  Polyethylene attached to tibia, knee reduced.  Patella held with a clamp.  Once cement hardened, the knee was injected with Exparel.  Hemovac placed. Arthrotomy closed with Ethibond, trying to advance and reinforce the anterior half of the MCL as much as possible with closure.  I did have a stable knee at completion.  Subcutaneous and subcuticular closure. Margins were injected with Marcaine.  Sterile compressive dressing applied.  Tourniquet inflated and removed.  Knee immobilizer applied. Anesthesia reversed.  Brought to the recovery room.  Tolerated the surgery well.  No complications.     Loreta Aveaniel F. Mayan Dolney, M.D.     DFM/MEDQ  D:  06/27/2014  T:  06/28/2014  Job:  [id number]

## 2014-06-28 NOTE — Progress Notes (Signed)
Occupational Therapy Treatment Patient Details Name: [name] MRN: [id number] DOB: 08/28/1965 Today's Date: 06/28/2014    History of present illness Pt is a 49  y/o F s/p R TKA.  Pt's PMH includes L TKA ~9 weeks ago, GERD, and PONV.   OT comments  Note pt. D/c later today.  This is pt.s 2nd knee so her and her spouse feel very comfortable with all modifications and transfer techniques.  Pts. Husband very active during session and demonstrated safe techniques while assisting pt.  They state they have all needed a/e and dme and have no further questions at this time.  Clear for d/c from OT also.  Will notify OTR/L  Follow Up Recommendations  No OT follow up;Supervision - Intermittent    Equipment Recommendations  None recommended by OT    Recommendations for Other Services      Precautions / Restrictions Precautions Precautions: Knee;Fall Precaution Comments: Reviewed no pillow under knee Restrictions RLE Weight Bearing: Weight bearing as tolerated       Mobility Bed Mobility Overal bed mobility: Needs Assistance Bed Mobility: Supine to Sit     Supine to sit: Min assist     General bed mobility comments: husband assisted bringing b les oob  Transfers Overall transfer level: Needs assistance Equipment used: Rolling walker (2 wheeled) Transfers: Sit to/from [employer] CorporationStand;Stand Pivot Transfers Sit to Stand: Supervision Stand pivot transfers: Min guard       General transfer comment: Supervision for safety only, no cues needed for technique     Balance                                   ADL Overall ADL's : Needs assistance/impaired               Lower Body Bathing Details (indicate cue type and reason): declined need for review, husband present and was able to verbalize his role in her care, she has had previous knee done so they felt very comfortable with the process       Lower Body Dressing Details (indicate cue type and reason): declined need  for review, husband present and was able to verbalize his role in her care, she has had previous knee done so they felt very comfortable with the process Toilet Transfer: Supervision/safety;Stand-pivot;RW Toilet Transfer Details (indicate cue type and reason): simulated with bsc with husband providing all assist Toileting- Clothing Manipulation and Hygiene: Minimal assistance;Sit to/from stand Toileting - Clothing Manipulation Details (indicate cue type and reason): simulated during transfer       General ADL Comments: pt. has had recent knee surgery, husband present for sesison and they both verbalized no need for further review stating they had no questions or concerns.  husband took active role in bed mobility and transfer assistance and demonstrated safe tech.      Vision                     Perception     Praxis      Cognition   Behavior During Therapy: [facility] for tasks assessed/performed Overall Cognitive Status: Within Functional Limits for tasks assessed                       Extremity/Trunk Assessment               Exercises     Shoulder Instructions  General Comments      Pertinent Vitals/ Pain       Pain Assessment: 0-10 Pain Score: 7  Pain Location: r knee Pain Descriptors / Indicators: Aching Pain Intervention(s): Monitored during session;Repositioned;Patient requesting pain meds-RN notified  Home Living                                          Prior Functioning/Environment              Frequency Min 2X/week     Progress Toward Goals  OT Goals(current goals can now be found in the care plan section)  Progress towards OT goals: Progressing toward goals     Plan Discharge plan remains appropriate    Co-evaluation                 End of Session Equipment Utilized During Treatment: Rolling walker   Activity Tolerance Patient tolerated treatment well   Patient Left in chair;with call  bell/phone within reach;with family/visitor present   Nurse Communication          Time: 7829-5621 OT Time Calculation (min): 12 min  Charges: OT Treatments $Self Care/Home Management : 8-22 mins  Robet Leu, COTA/L 06/28/2014, 8:24 AM

## 2014-06-28 NOTE — Progress Notes (Signed)
Physical Therapy Treatment Patient Details Name: [name] MRN: [id number] DOB: 08/04/1965 Today's Date: 06/28/2014    History of Present Illness Pt is a 49  y/o F s/p R TKA.  Pt's PMH includes L TKA ~9 weeks ago, GERD, and PONV.    PT Comments    Patient is making good progress with PT.  From a mobility standpoint anticipate patient will be ready for DC home today pending medical clearance.  Pt demonstrated ability to ambulate 130 ft this session.        Follow Up Recommendations  Home health PT;Supervision for mobility/OOB     Equipment Recommendations  None recommended by PT    Recommendations for Other Services       Precautions / Restrictions Precautions Precautions: Knee;Fall Precaution Comments: Reviewed no pillow under knee Required Braces or Orthoses: Knee Immobilizer - Right Knee Immobilizer - Right: Other (comment) (in room, no specific order for KI) Restrictions Weight Bearing Restrictions: Yes RLE Weight Bearing: Weight bearing as tolerated    Mobility  Bed Mobility               General bed mobility comments: in recliner  Transfers Overall transfer level: Needs assistance Equipment used: Rolling walker (2 wheeled) Transfers: Sit to/from Stand Sit to Stand: Supervision         General transfer comment: Supervision for safety, no cues needed  Ambulation/Gait Ambulation/Gait assistance: Min guard Ambulation Distance (Feet): 130 Feet Assistive device: Rolling walker (2 wheeled) Gait Pattern/deviations: Step-to pattern;Decreased stride length;Decreased stance time - right;Antalgic;Trunk flexed   Gait velocity interpretation: Below normal speed for age/gender General Gait Details: Close chair follow.  Pt required 3 sitting rest breaks this afternoon 2/2 pain and dizziness which both subsided once pt sitting.  Cues to stand upright.    Stairs            Wheelchair Mobility    Modified Rankin (Stroke Patients Only)        Balance Overall balance assessment: Needs assistance Sitting-balance support: No upper extremity supported;Feet supported Sitting balance-Leahy Scale: Good     Standing balance support: Bilateral upper extremity supported;During functional activity Standing balance-Leahy Scale: Fair                      Cognition Arousal/Alertness: Awake/alert Behavior During Therapy: WFL for tasks assessed/performed Overall Cognitive Status: Within Functional Limits for tasks assessed                      Exercises Total Joint Exercises Ankle Circles/Pumps: AROM;Both;Seated;10 reps Quad Sets: AROM;Both;10 reps;Seated Heel Slides: AAROM;Right;10 reps;Seated    General Comments        Pertinent Vitals/Pain Pain Assessment: 0-10 Pain Score: 6  Pain Location: R knee Pain Descriptors / Indicators: Grimacing;Guarding;Moaning;Aching Pain Intervention(s): Limited activity within patient's tolerance;Monitored during session;Repositioned    Home Living                      Prior Function            PT Goals (current goals can now be found in the care plan section) Acute Rehab PT Goals Patient Stated Goal: go home today PT Goal Formulation: With patient/family Time For Goal Achievement: 07/04/14 Potential to Achieve Goals: Good Progress towards PT goals: Progressing toward goals    Frequency  7X/week    PT Plan Current plan remains appropriate    Co-evaluation  End of Session Equipment Utilized During Treatment: Gait belt;Right knee immobilizer Activity Tolerance: Patient limited by pain;Patient tolerated treatment well (limited by dizziness) Patient left: in chair;with call bell/phone within reach;with family/visitor present     Time: 1352-1419 PT Time Calculation (min) (ACUTE ONLY): 27 min  Charges:  $Gait Training: 23-37 mins                    G Codes:      Michail Jewels PT, [place] [phone] Pager: [phone] 06/28/2014, 3:21 PM

## 2017-01-05 IMAGING — CR DG KNEE 1-2V PORT*R*
2 series · 2 of 2 positions shown · non-contrast
Comparison: None.

CLINICAL DATA: Postop right knee replacement

EXAM:
PORTABLE RIGHT KNEE - 1-2 VIEW

[ap]
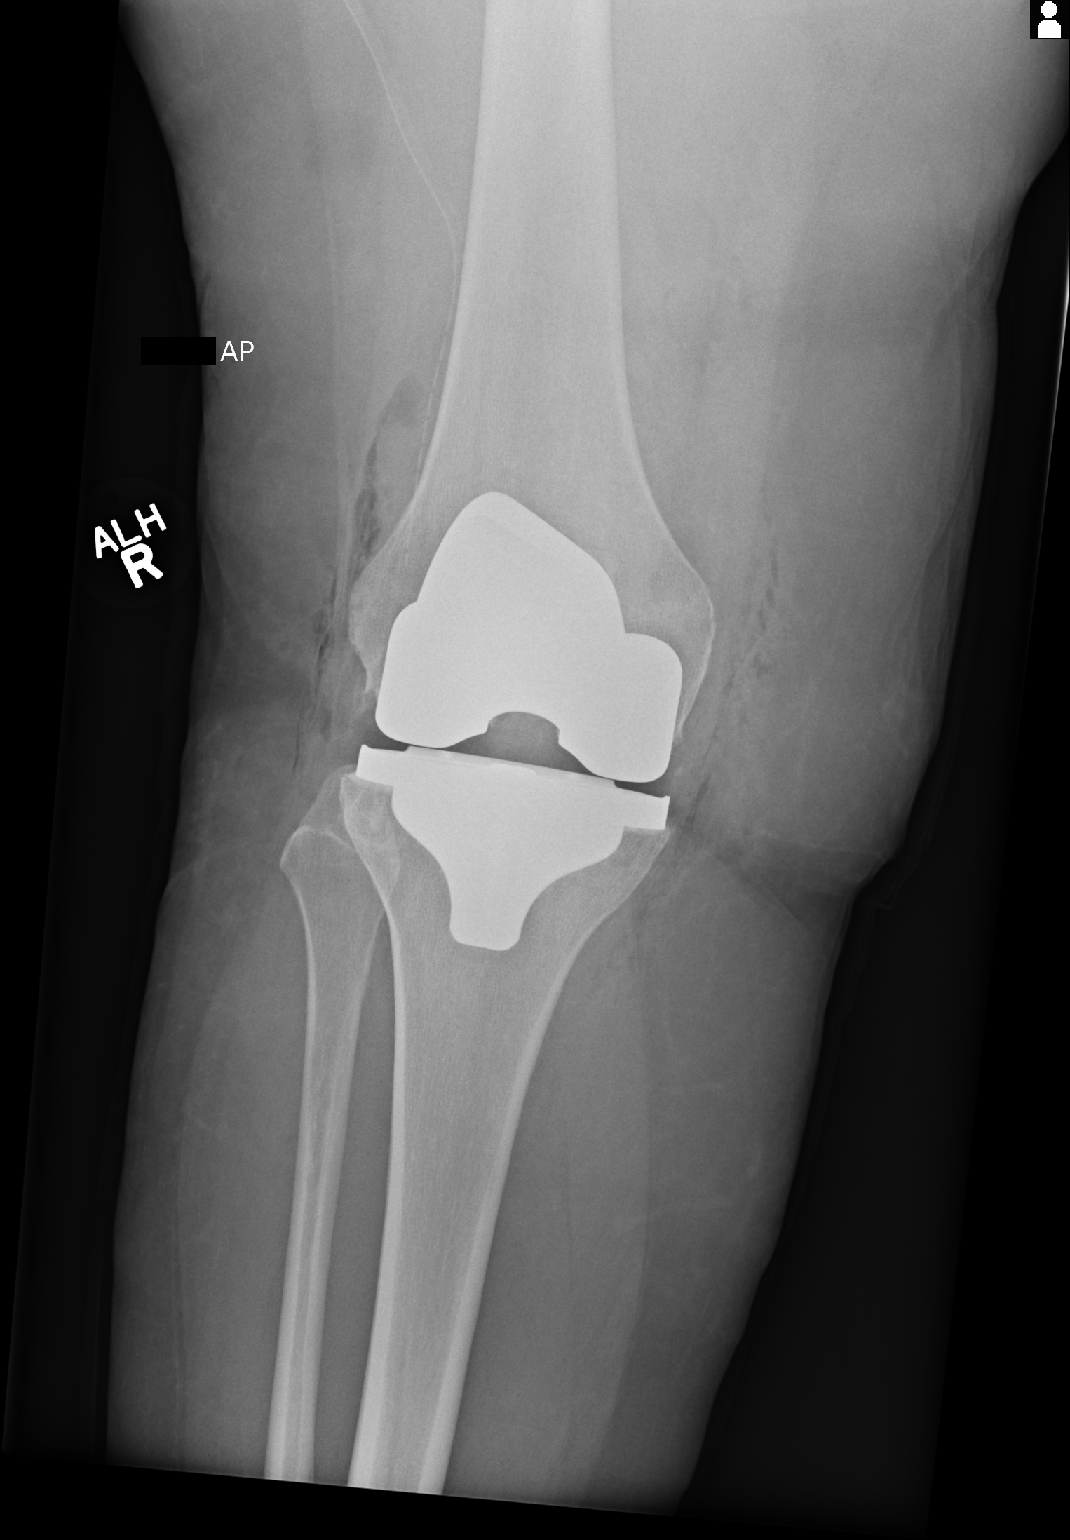

[lat]
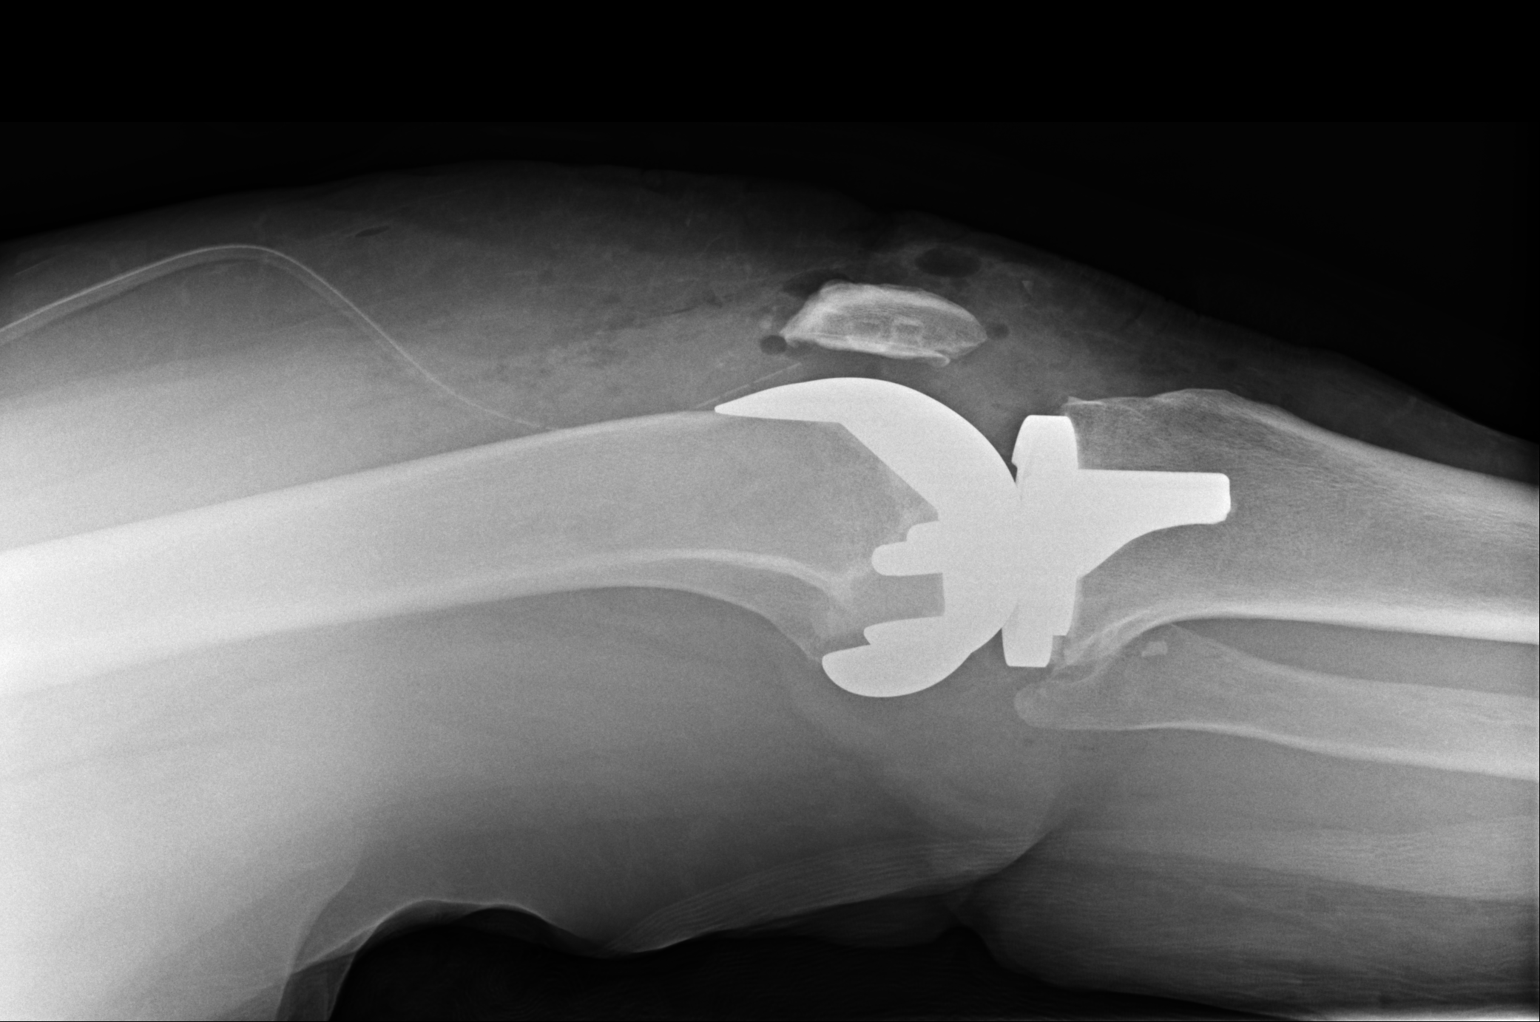

[2 of 2 positions shown; findings below may reference images not displayed]

FINDINGS: Right total knee arthroplasty in satisfactory position.

Associated subcutaneous gas and surgical drain.

No fracture or dislocation is seen.
IMPRESSION: Right total knee arthroplasty in satisfactory position.
